# Patient Record
Sex: Female | Born: 1935 | Race: White | Hispanic: No | Smoking: Never smoker
Health system: Southern US, Community
[De-identification: ages and names within clinical notes are randomized; demographics above are authoritative.]

## PROBLEM LIST (undated history)

## (undated) DIAGNOSIS — C50919 Malignant neoplasm of unspecified site of unspecified female breast: Secondary | ICD-10-CM

## (undated) DIAGNOSIS — Z87898 Personal history of other specified conditions: Secondary | ICD-10-CM

## (undated) DIAGNOSIS — I5022 Chronic systolic (congestive) heart failure: Secondary | ICD-10-CM

## (undated) DIAGNOSIS — I350 Nonrheumatic aortic (valve) stenosis: Secondary | ICD-10-CM

## (undated) DIAGNOSIS — R252 Cramp and spasm: Secondary | ICD-10-CM

## (undated) DIAGNOSIS — R5383 Other fatigue: Secondary | ICD-10-CM

## (undated) DIAGNOSIS — N189 Chronic kidney disease, unspecified: Secondary | ICD-10-CM

## (undated) DIAGNOSIS — I679 Cerebrovascular disease, unspecified: Secondary | ICD-10-CM

## (undated) DIAGNOSIS — I050 Rheumatic mitral stenosis: Secondary | ICD-10-CM

## (undated) DIAGNOSIS — I340 Nonrheumatic mitral (valve) insufficiency: Secondary | ICD-10-CM

## (undated) DIAGNOSIS — I071 Rheumatic tricuspid insufficiency: Secondary | ICD-10-CM

## (undated) DIAGNOSIS — I517 Cardiomegaly: Secondary | ICD-10-CM

## (undated) DIAGNOSIS — I272 Pulmonary hypertension, unspecified: Secondary | ICD-10-CM

## (undated) HISTORY — PX: MASTECTOMY: SHX3

## (undated) HISTORY — DX: Rheumatic tricuspid insufficiency: I07.1

## (undated) HISTORY — DX: Malignant neoplasm of unspecified site of unspecified female breast: C50.919

## (undated) HISTORY — DX: Cramp and spasm: R25.2

## (undated) HISTORY — DX: Cardiomegaly: I51.7

## (undated) HISTORY — DX: Personal history of other specified conditions: Z87.898

## (undated) HISTORY — DX: Pulmonary hypertension, unspecified: I27.20

## (undated) HISTORY — PX: APPENDECTOMY: SHX54

## (undated) HISTORY — DX: Nonrheumatic mitral (valve) insufficiency: I34.0

## (undated) HISTORY — DX: Rheumatic mitral stenosis: I05.0

## (undated) HISTORY — DX: Other fatigue: R53.83

---

## 1998-05-17 ENCOUNTER — Other Ambulatory Visit: Admission: RE | Admit: 1998-05-17 | Discharge: 1998-05-17 | Payer: Self-pay | Admitting: Cardiology

## 1999-10-31 ENCOUNTER — Other Ambulatory Visit: Admission: RE | Admit: 1999-10-31 | Discharge: 1999-10-31 | Payer: Self-pay | Admitting: General Surgery

## 2000-01-29 ENCOUNTER — Encounter: Admission: RE | Admit: 2000-01-29 | Discharge: 2000-04-28 | Payer: Self-pay | Admitting: *Deleted

## 2000-06-04 ENCOUNTER — Other Ambulatory Visit: Admission: RE | Admit: 2000-06-04 | Discharge: 2000-06-04 | Payer: Self-pay | Admitting: *Deleted

## 2000-10-01 ENCOUNTER — Ambulatory Visit (HOSPITAL_COMMUNITY): Admission: RE | Admit: 2000-10-01 | Discharge: 2000-10-01 | Payer: Self-pay | Admitting: Specialist

## 2001-06-02 ENCOUNTER — Other Ambulatory Visit: Admission: RE | Admit: 2001-06-02 | Discharge: 2001-06-02 | Payer: Self-pay | Admitting: *Deleted

## 2002-10-11 ENCOUNTER — Other Ambulatory Visit: Admission: RE | Admit: 2002-10-11 | Discharge: 2002-10-11 | Payer: Self-pay | Admitting: *Deleted

## 2003-12-21 ENCOUNTER — Encounter: Admission: RE | Admit: 2003-12-21 | Discharge: 2004-01-10 | Payer: Self-pay | Admitting: *Deleted

## 2005-05-24 ENCOUNTER — Encounter: Admission: RE | Admit: 2005-05-24 | Discharge: 2005-05-24 | Payer: Self-pay | Admitting: Internal Medicine

## 2006-09-17 ENCOUNTER — Inpatient Hospital Stay (HOSPITAL_COMMUNITY): Admission: EM | Admit: 2006-09-17 | Discharge: 2006-10-03 | Payer: Self-pay | Admitting: Emergency Medicine

## 2006-09-17 ENCOUNTER — Encounter (INDEPENDENT_AMBULATORY_CARE_PROVIDER_SITE_OTHER): Payer: Self-pay | Admitting: *Deleted

## 2006-09-26 ENCOUNTER — Encounter: Payer: Self-pay | Admitting: Cardiology

## 2006-11-03 ENCOUNTER — Encounter: Admission: RE | Admit: 2006-11-03 | Discharge: 2006-11-03 | Payer: Self-pay | Admitting: General Surgery

## 2009-01-22 IMAGING — CT CT ABDOMEN W/ CM
2 of 6 series · 16 of 46 positions shown, 18 images · IV contrast (omnipaque)
Comparison: None

ABDOMEN CT WITH CONTRAST

CLINICAL DATA: Lower abdominal pain
TECHNIQUE: Multidetector CT imaging of the abdomen and pelvis was performed
following the standard protocol during bolus administration of intravenous
contrast.

Contrast:  125 cc Omnipaque 300

[Series 2: abd_pel 5.0 b40f st · axial · 0.65mm/px · z∈[-406,-30]mm · 13 of 87 slices shown, 15 images]
[im 6/87  soft-tissue]
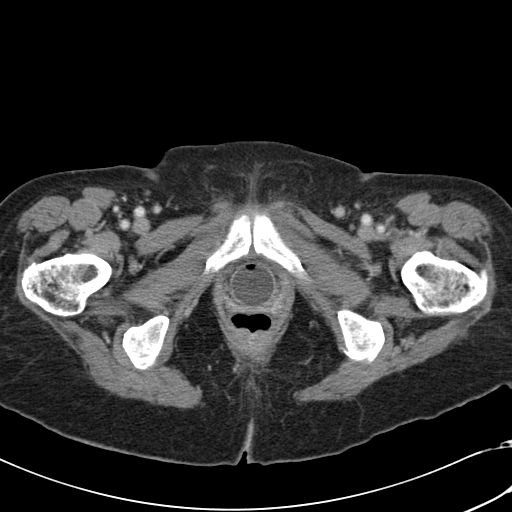
[im 6/87  bone]
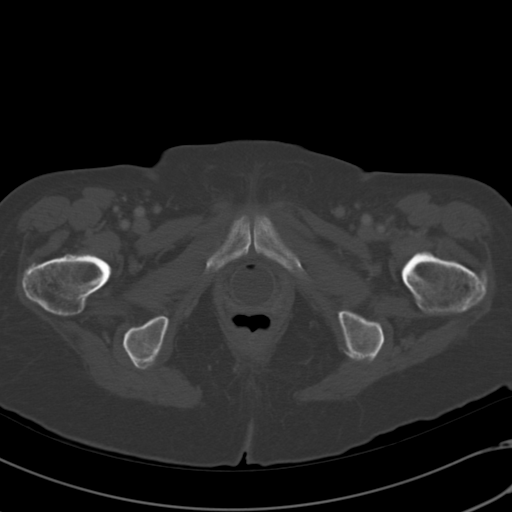
[im 11/87  soft-tissue]
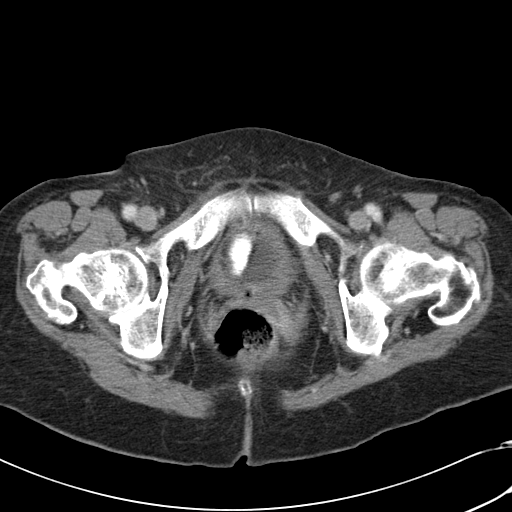
[im 21/87  soft-tissue]
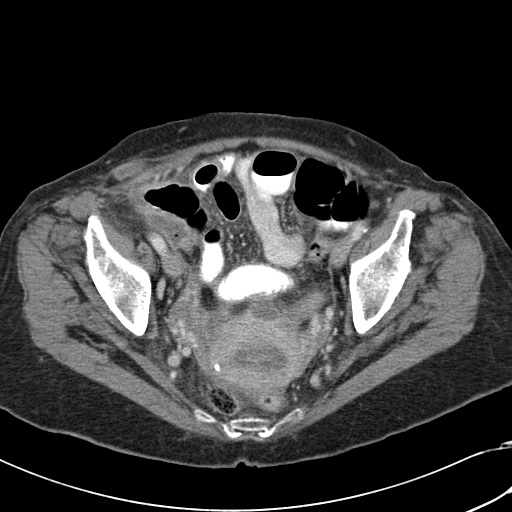
[im 26/87  soft-tissue]
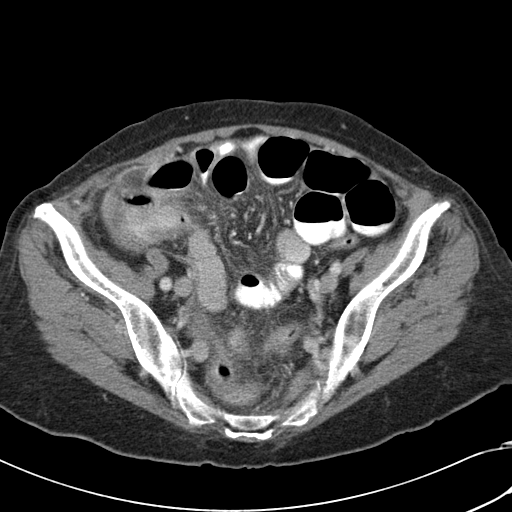
[im 31/87  soft-tissue]
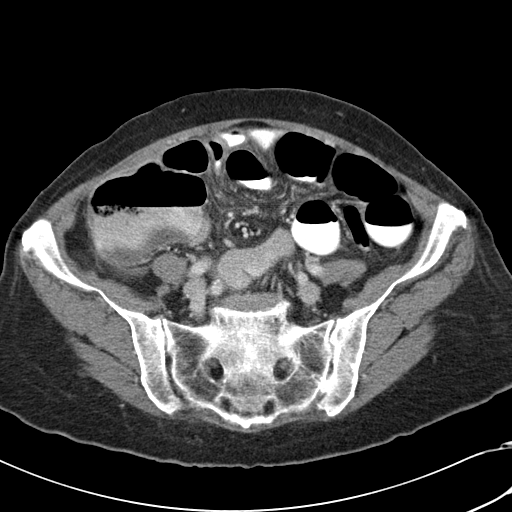
[im 36/87  soft-tissue]
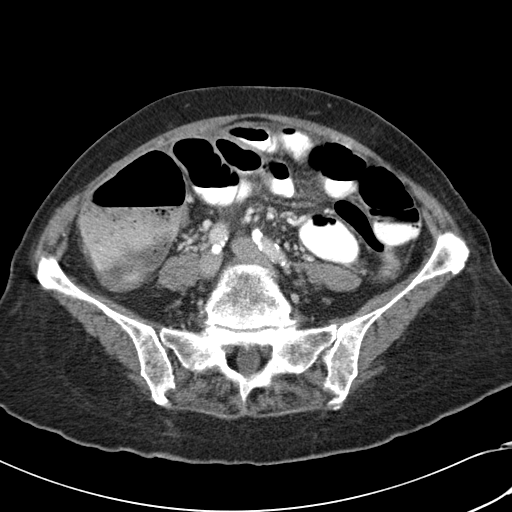
[im 46/87  soft-tissue]
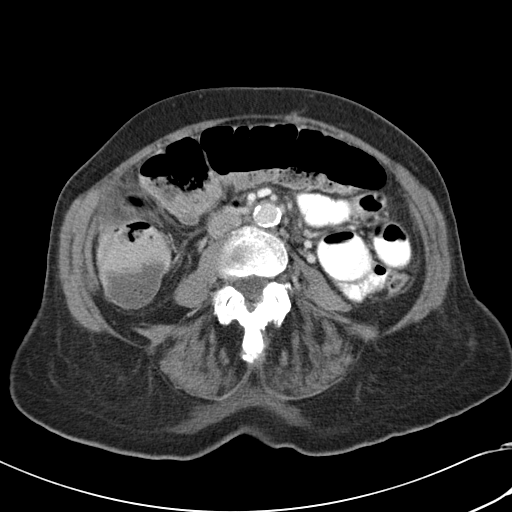
[im 51/87  soft-tissue]
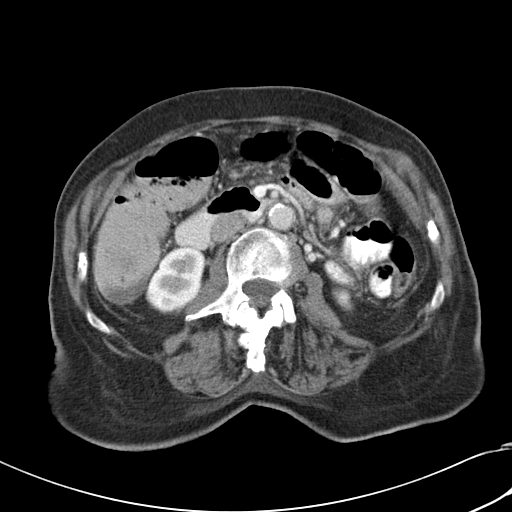
[im 56/87  soft-tissue]
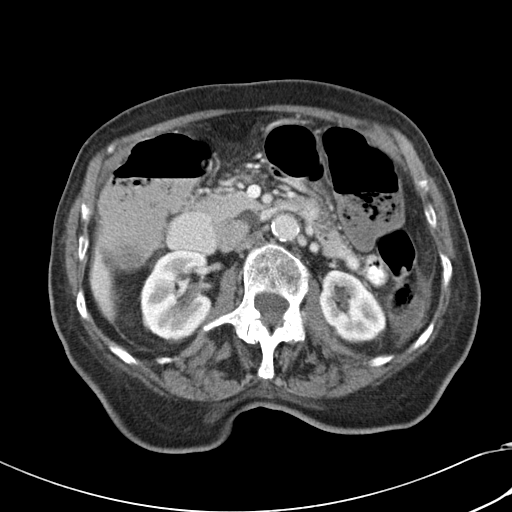
[im 56/87  bone]
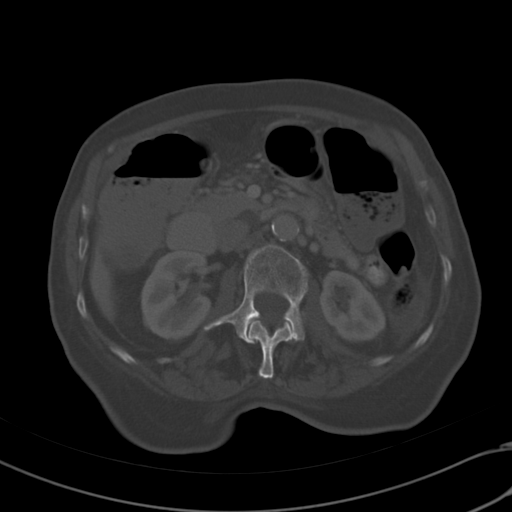
[im 61/87  soft-tissue]
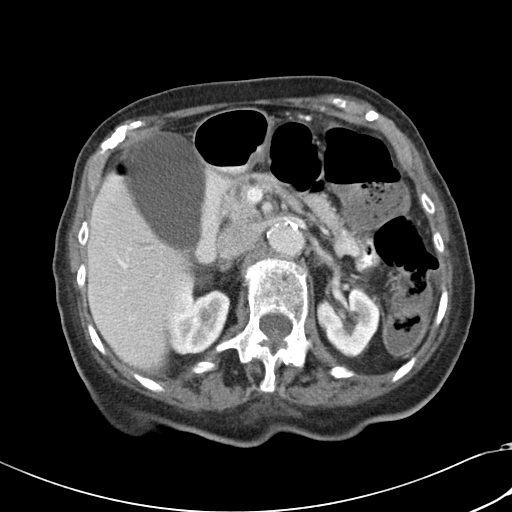
[im 66/87  soft-tissue]
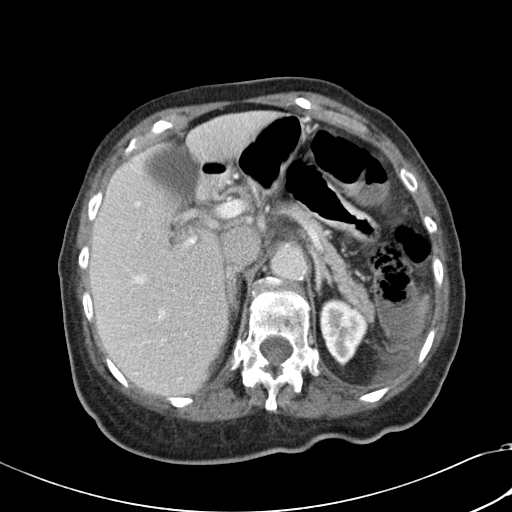
[im 76/87  soft-tissue]
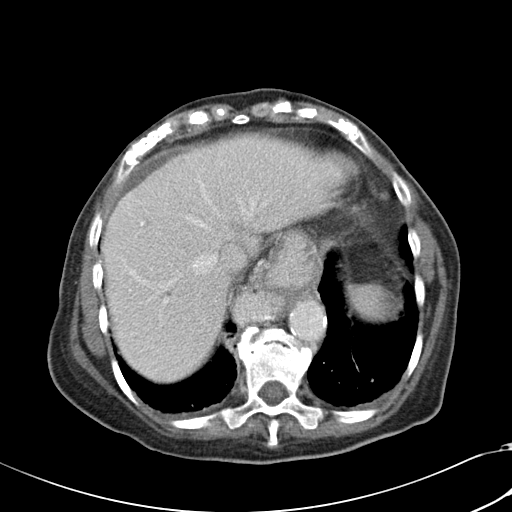
[im 81/87  soft-tissue]
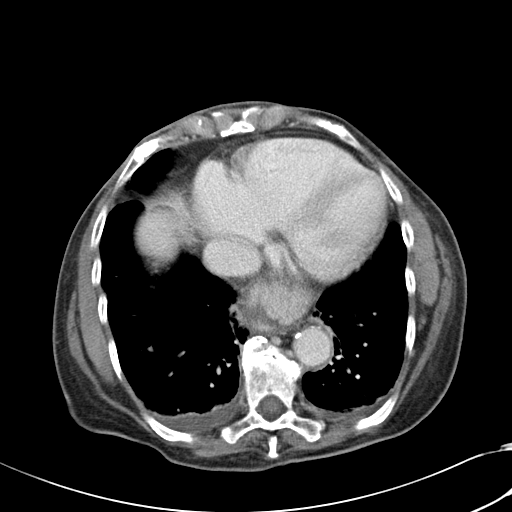

[Series 602: coronal images · coronal · 0.89mm/px · 3 of 74 slices shown]
[im 25/74  soft-tissue]
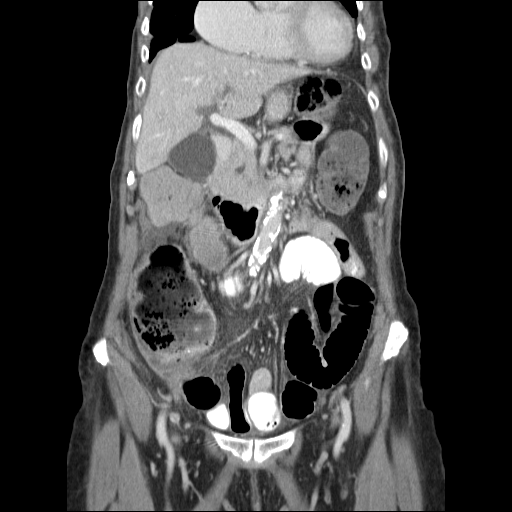
[im 33/74  soft-tissue]
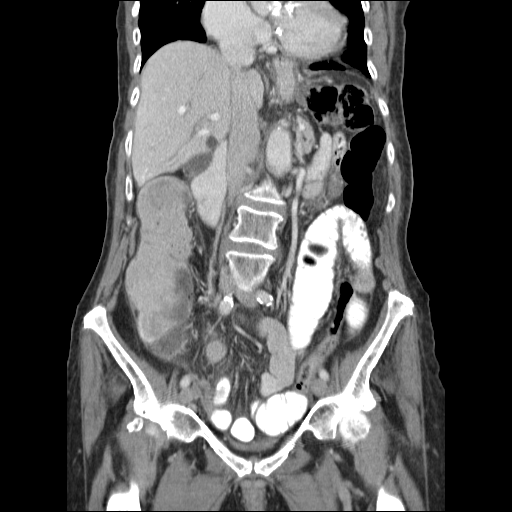
[im 41/74  soft-tissue]
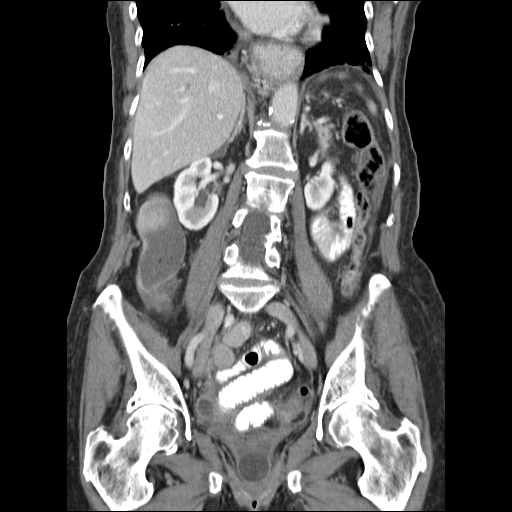

[16 of 46 positions shown; findings below may reference images not displayed]

FINDINGS: There is mild gaseous distention of the colon, with scattered
air-fluid levels. Mildly prominent small bowel loops noted as well. There is
small amount of edema adjacent to the right side of the colon. The colon
underlying this area is not thick walled. Small amount of fluid is also noted
adjacent to the proximal descending colon. This is seen on image 34-35.

There is mild gallbladder distention without definite wall thickening. Slight
intrahepatic biliary prominence noted. No focal lesion in the liver, spleen,
pancreas, adrenals, or kidneys. No free fluid, or free air.

There are small bilateral pleural effusions and minimal bibasilar atelectasis.
Mild cardiomegaly noted. Moderate sized hiatal hernia present.

IMPRESSION

Nonspecific bowel gas pattern with mild gaseous distention of colon and small
bowel. Question mild ileus. There are air-fluid levels seen within the colon
which can be seen with gastroenteritis. There are also vague areas of edema or
free fluid adjacent to both the ascending and descending colon without
underlying wall thickening.

Mild distention of the gallbladder without definite stones or wall thickening.

PELVIS CT WITH CONTRAST
FINDINGS: There is a small amount of fluid and/or stranding noted adjacent to
the cecum. A small amount of free fluid noted in the pelvis. Again, no areas of
wall thickening within the colon. Mildly distended small bowel loops and colon
again noted in the pelvis as described in the abdomen report. Foley catheter is
in a decompressed bladder. There is fluid within the endometrium. No adnexal
masses.

IMPRESSION

Again, somewhat nondescript and nonspecific bowel gas pattern with mild gaseous
distention of small bowel and colon. Air-fluid levels in the colon present.
There is a small amount of free fluid. Slight stranding adjacent to the cecum
without underlying wall thickening. Appendix not definitively seen. Findings are
nonspecific. Question gastroenteritis. 

Fluid within the endometrium which is an abnormal finding in this age group.
This can be followed up with elective pelvic ultrasound.

## 2010-05-21 ENCOUNTER — Emergency Department (HOSPITAL_COMMUNITY): Admission: EM | Admit: 2010-05-21 | Discharge: 2010-05-21 | Payer: Self-pay | Admitting: Emergency Medicine

## 2010-05-21 ENCOUNTER — Ambulatory Visit: Payer: Self-pay | Admitting: Oncology

## 2010-05-29 ENCOUNTER — Ambulatory Visit (HOSPITAL_COMMUNITY): Admission: RE | Admit: 2010-05-29 | Discharge: 2010-05-29 | Payer: Self-pay | Admitting: Oncology

## 2010-05-29 LAB — CBC WITH DIFFERENTIAL/PLATELET
BASO%: 0.2 % (ref 0.0–2.0)
Eosinophils Absolute: 0.1 10*3/uL (ref 0.0–0.5)
LYMPH%: 15.1 % (ref 14.0–49.7)
NEUT%: 74.1 % (ref 38.4–76.8)
nRBC: 0 % (ref 0–0)

## 2010-05-29 LAB — LACTATE DEHYDROGENASE: LDH: 240 U/L (ref 94–250)

## 2010-05-29 LAB — COMPREHENSIVE METABOLIC PANEL
Albumin: 4.2 g/dL (ref 3.5–5.2)
BUN: 23 mg/dL (ref 6–23)
Calcium: 9.9 mg/dL (ref 8.4–10.5)
Glucose, Bld: 122 mg/dL — ABNORMAL HIGH (ref 70–99)
Total Bilirubin: 0.6 mg/dL (ref 0.3–1.2)
Total Protein: 6.9 g/dL (ref 6.0–8.3)

## 2010-06-07 ENCOUNTER — Ambulatory Visit (HOSPITAL_COMMUNITY): Admission: RE | Admit: 2010-06-07 | Discharge: 2010-06-07 | Payer: Self-pay | Admitting: Oncology

## 2010-07-16 ENCOUNTER — Ambulatory Visit: Payer: Self-pay | Admitting: Cardiology

## 2010-08-18 ENCOUNTER — Encounter: Payer: Self-pay | Admitting: Orthopedic Surgery

## 2010-11-09 ENCOUNTER — Telehealth: Payer: Self-pay | Admitting: *Deleted

## 2010-11-09 ENCOUNTER — Other Ambulatory Visit: Payer: Self-pay | Admitting: Cardiology

## 2010-11-09 NOTE — Telephone Encounter (Signed)
Called Rx. For Ntg 0.4 numbers 25 x 1yr. Refill to [employer] [facility] [employer] [employer]

## 2010-11-09 NOTE — Telephone Encounter (Signed)
PATIENT NEED SCRIPT FOR NTG, SCRIPT SHE HAD WAS TOO OLD.  PLEASE CALL AND FILL.  PHARM [phone].

## 2010-11-09 NOTE — Telephone Encounter (Signed)
Patient called with chest pressure,going on for several days.She spoke with Dr. Patty Sermons and she will try NTG and if no better she will go to ER at [facility].

## 2010-11-13 ENCOUNTER — Other Ambulatory Visit: Payer: Self-pay | Admitting: *Deleted

## 2010-11-13 MED ORDER — NITROGLYCERIN 0.4 MG SL SUBL: 0.4000 mg | SUBLINGUAL_TABLET | SUBLINGUAL | Status: AC | PRN

## 2010-11-13 NOTE — Telephone Encounter (Signed)
Refilled meds per fax request.  

## 2010-12-14 NOTE — Discharge Summary (Signed)
[name]                    ACCOUNT NO.:  [id number]   MEDICAL RECORD NO.:  [id number]         PATIENT TYPE:  LINP   LOCATION:                               FACILITY:  [facility]   PHYSICIAN:  Anselm Pancoast. Weatherly, M.D.DATE OF BIRTH:  07-26-1916   DATE OF ADMISSION:  09/17/2006  DATE OF DISCHARGE:  10/02/2006                               DISCHARGE SUMMARY   DISCHARGE DIAGNOSES:  1. Ruptured appendicitis with pelvic abscess.  2. Acute cholecystitis postoperative.  3. Congestive heart failure.   HISTORY:  [name] is an 77 year old Caucasian female who resides at  [facility] and she was thought to be having the flu syndrome for  approximately five days with nausea, vomiting, feeling poorly.  She is  followed by Dr. Frederik Pear, her primary physician, and was having nausea,  vomiting and fevers, and with this not improving, she was brought to the  [facility] Emergency Room where laboratory studies and CT were  obtained.  The CT was nonocclusive, had some nonspecific findings and  she was admitted by the hospice.  Dr. Hettie Holstein service, and the  following day, I was asked to see her, in that she had an elevated white  count on admission, but the CT had not shown any findings consistent  with an appendicitis or acute surgical problems, but he was concerned  that her abdominal pain was worsening and I was asked to see her on mid-  morning on the 19th.  On the examination, she was definitely tender,  more in the lower abdomen than in the upper abdomen and I reviewed the  CT with another radiologist, and on review of vertical films, not the  chronological cuts, we thought we could see an appendix very low in the  pelvis with __________  appendiceal fluid and just a little speck of  air.  The opinion at first was a nonspecific __________  no acute  surgical problem was thought to be a ruptured appendicitis.  I also  repeated her white count and it had increased from 14,000 to  19,000.  Arrangements were made to put her on the OR schedule as soon as possible  and she was taken to the OR approximately 2:00 p.m. for a laparotomy.  Her past history is significant in that she had a double mastectomy  about 35 years ago and is on tamoxifen.  She also is followed by a  cardiovascular standpoint by Dr. Patty Sermons and was on Lasix 40 mg a day  for __________  mild congestive heart failure.  She also takes Vitamin D  and Centrum Silver and she takes Bufferin over-the-counter for general  aches and pains.  On physical exam on admission, she was not febrile.  Her blood pressure was 104/60 and her O2 sats were fine.  On her  admission physical exam, her abdomen was described with some peritoneal  signs, more localized in the right lower quadrant, and kind of a vague  general abdominal tenderness which would be more likely consistent with  a surgical evaluation.  She had already  been started on antibiotics when  I saw her and she was taken to surgery.  Dr. Michaell Cowing assisted.  There was  a small midline incision.  She was found to have a ruptured appendicitis  with a pelvic abscess and several areas of __________  loop abscesses  and this obviously had been brewing for several days.  Post, I believe  she did go the stepdown ICU, but she really was able to breath  spontaneously, etc.  Since we had placed a nasogastric tube and she had  basically bilateral edema in each arm from her previous mastectomies, we  asked for a PICC line to be placed so we could start her on  hyperalimentation and this was performed the following day.  Her  creatinine clearance is low and we started her on TPN, which she  tolerated without problems.  Her cultures from the OR were consistent  with a ruptured abdominal viscus.  The primary organism is streptococcal  group F and also some gram positive rods noted on the smear, and the  path report showed acute suppurative appendicitis with perforation and   marked serositis.  The patient appeared to be improving.  Her abdomen  was becoming less tender.  She was transferred to the floor.  Still had  a Foley catheter.  We had placed a Blake drain in the lower abdomen, and  about 5 days after her surgery, she started having more abdominal  tenderness, and this was in the upper abdomen.  She was also having a  little low grade fever and the concern for possible intra-abdominal  abscess was raised and we repeated the CT on 09/23/2006, and this shows  a markedly distended gallbladder and bilateral pleural effusions.  No  evidence of intra-abdominal abscess and she was distended enough that it  would have been impossible to do a laparoscopic cholecystectomy.  I  recommended we proceed with a percutaneous drain of the gallbladder with  fluoro and this was performed with her pain greatly decreasing and she  sort of turned around fairly quickly at this point.  It appears that the  gallbladder had no stones noted on the CT or the ultrasound when we  placed the drain, was a secondary thing, probably secondary to not  eating, and she was not certainly not having acute gallbladder which she  was initially admitted.  The fluid from the gallbladder was no growth.  She was being treated with Zosyn and Flagyl initially for the ruptured  appendicitis and with the percutaneous drainage her abdominal pain  promptly decreased.  The NG tube could be removed the following day, and  then we started her on a diet which has been advanced up to a cardiac  diet.  With the bilateral pleural effusions and we had been giving her  intermittent Lasix and her IV fluids had always been at about 100 mL an  hour, which is on the low side.  I asked Dr. Patty Sermons to see her since  he is her regular physician, and wondered if we could do something to  kind of digitalize her or improve her cardiac function, and he saw her approximately two days later and recommended that we add  lisinopril 5  b.i.d. and Toprol XL 12.5 daily and did a bunch of cardiac evaluations.  A 2D echo showed a left ventricular ejection fraction of about 45-50%,  and kind of a diastolic dysfunction.  She continues to improve.  Her  abdominal incisions have healed  nicely and have been stable to remove  the Steri-strips.  The drain that was in the pelvis never had a large  amount of fluid following the first 24 hours and the drain has been  removed.  Of course, she still has the drain in her gallbladder and we  are planning to let her return to [facility] and we will evaluate the  gallbladder on whether we can just remove the drain in approximately  three weeks or whether it is going to be necessary to actually do a  cholecystectomy if stones would be identified.  She has never had  symptoms consistent with a gallbladder attack prior to this time.   DISCHARGE MEDICATIONS:  1. K-Dur 10 mg 2 tablets twice a day.  2. Lasix 40 mg a day.  3. Lisinopril 10 mg b.i.d..  4. Toprol XL 25 mg 1 a day.  5. Ecotrin 325 mg daily.  6. Tylenol as needed for pain.  7. Tamoxifen - 20 mg a day.  8. Centrum Silver 1 tablet daily.   Dr. Patty Sermons has left prescriptions for the K-Dur, lisinopril and  Toprol, and she already has a prescription for the Lasix which has been  a chronic medication.  Her abdomen is soft.  She is eating a cardiac low  salt diet without problems, having good bowel function, and is no longer  tender in the lower abdomen.  I will see her in the office in  approximately two weeks.  Dr. Patty Sermons has arranged to see her in  approximately three weeks.  She will go to infirmary at [facility]  for a few days.  She should be able to get back to the independent  living status after a short time if she is ambulatory and her O2 sats  are fine on room air.           ______________________________  Anselm Pancoast. Zachery Dakins, M.D.     WJW/MEDQ  D:  10/02/2006  T:  10/02/2006  Job:   [id number]   cc:   Cassell Clement, M.D.  Fax: [phone]   Lenon Curt. Chilton Si, M.D.  Fax: [phone]

## 2010-12-14 NOTE — H&P (Signed)
NAMEDEMARA, LOVER                    ACCOUNT NO.:  [id number]   MEDICAL RECORD NO.:  [id number]          PATIENT TYPE:  EMS   LOCATION:  ED                           FACILITY:  [facility]   PHYSICIAN:  Hettie Holstein, D.O.    DATE OF BIRTH:  1915-09-01   DATE OF ADMISSION:  09/16/2006  DATE OF DISCHARGE:                              HISTORY & PHYSICAL   PRIMARY CARE PHYSICIAN:  Lenon Curt. Chilton Si, M.D.   CHIEF COMPLAINT:  Severe abdominal pain.   HISTORY OF PRESENT ILLNESS:  Ms. [name] is a pleasant 77 year old  Caucasian female who resides at [facility].  She had been in her  usual state of health up until about a week ago when she stated she  developed flu-like symptoms.  About 3 days ago she developed nausea,  vomiting, and fevers; and she was evaluated in the emergency department.  Her CT scan was inconclusive and had some nonspecific findings.  Her  clinical exam was consistent with acute abdomen and rebound as well as  guarding.  Unfortunately general surgery consultation has not been  entertained until 10:00 a.m. which I have contacted Dr. Zachery Dakins for  evaluation.  Her CT scan in the department is as noted above, poorly  specific.  In any event her white count is 14.6; and she is not  currently febrile though describing fevers at home.   PAST MEDICAL HISTORY:  This is significant for breast cancer.  She has  had a double mastectomy one in [place], about 35 years ago; and the  other at [facility] about 2 years ago.  She had radiation treatment.  She has  history of cataract extraction and implants as well.  She states that  she has an enlarged heart otherwise no other information in this regard  is available.   SURGICAL HISTORY:  She retains her gallbladders, uterus, and appendix.   MEDICATIONS AT THE FACILITY:  1. Tamoxifen daily.  2. Lasix 40 mg a day.  3. Vitamin D once daily.  4. Centrum Silver once daily.   ALLERGIES:  She is allergic SODIUM.   SOCIAL HISTORY:  Her  daughter, Melburn Popper, can be reached at [phone]  or [phone]; her son is reachable at [phone] or [phone].  She  does not smoke or drink.  She resides at [facility] and continues to  drive.   FAMILY HISTORY:  This is noncontributory.   REVIEW OF SYSTEMS:  Noted above.  She reports a flu-like illness about a  week ago; and other than that, she has been in her usual state.  No  blood in her stools or emesis. Further review is unremarkable.   PHYSICAL EXAMINATION:  GENERAL:  In the department, initially she was  tachycardiac.  She received some IV fluids.  VITAL SIGNS:  Blood pressure, now, 104/60, temperature 98.1, heart rate  97, respirations 20, O2 saturation 96%.  HEENT:  Head is normocephalic, atraumatic.  Extraocular muscles are  intact.  NECK:  Supple, nontender with no palpable thyromegaly or mass.  CARDIOVASCULAR EXAM:  Reveals normal S1-S2.  LUNGS:  Clear to auscultation bilaterally.  She exhibits normal effort,  and no dullness to percussion.  ABDOMEN:  She does exhibit some guarding, some peritoneal signs with  exquisite tenderness and palpation of her right lower and right upper  quadrants.  She does not appear to be in extremis at this time.  EXTREMITIES:  Reveal trace bipedal edema.   LABORATORY DATA:  Her CT scan, as noted above, revealed nonspecific  bowel gas pattern, questionable gastroenteritis, and some fluid in her  uterus with recommendations for followup ultrasound at some point.  Sodium 128, potassium 2.9, BUN 19, creatinine 1.0, glucose 119, chloride  91, CO2 26.  SG/ALT 54/35, albumin 2.5.  WBC 4.6, hemoglobin 13.8,  platelet count of 176, MCV of 90.   ASSESSMENT:  1. Right lower quadrant pain, will need to rule out appendicitis with      general surgical evaluation and consultation.  2. Fever and leukocytosis.  3. History of breast cancer.  4. Fluid noted in her uterus.  5. Hypoalbuminemia.  6. Hypokalemia.  7. No code [place] status.    PLANS:  At this time, we have asked Dr. Zachery Dakins of general surgery to  consult on Ms. Kevorkian to evaluate for appendicitis.  In the meantime we  will start her on antibiotics empirically.      Hettie Holstein, D.O.  Electronically Signed     ESS/MEDQ  D:  09/17/2006  T:  09/17/2006  Job:  [id number]   cc:   Lenon Curt. Chilton Si, M.D.  Fax: [phone]

## 2010-12-14 NOTE — Consult Note (Signed)
[name], [name]                    ACCOUNT NO.:  [id number]   MEDICAL RECORD NO.:  [id number]          PATIENT TYPE:  INP   LOCATION:  1603                         FACILITY:  [facility]   PHYSICIAN:  Cassell Clement, M.D. DATE OF BIRTH:  Jun 01, 1916   DATE OF CONSULTATION:  09/25/2006  DATE OF DISCHARGE:                                 CONSULTATION   This is an 77 year old Caucasian female known to me.  She was admitted  on September 16, 2006, with abdominal pain and was found to have an acute  appendicitis with rupture and multiple intra-abdominal abscess.  She had  been sick for the previous week with a flu syndrome out at [facility], suffering bloating and cramping and lack of appetite.  She had  been urged by her family to go see the doctor several days earlier but  did not and became worse on was a prior to surgery.  She presented with  elevated white count and she was begun empirically on Cipro and Flagyl  and on the morning after admission, surgical consultation was requested  and Dr. Zachery Dakins saw the patient and felt that she had the possibility  of acute appendicitis and recommended urgent surgery.   The patient has a past history of cardiomegaly.  She was last seen in  our office in December 2006.  On July 18, 2005, she underwent a two-  dimensional echocardiogram which showed an ejection fraction of 55-60%,  impaired diastolic relaxation, moderate aortic stenosis, moderate to  severe mitral regurgitation, severe tricuspid regurgitation, and mild  pulmonary hypertension with an estimated right ventricular systolic  pressure of 49.  On August 08, 2005, she underwent an adenosine  Cardiolite stress test in our office which showed an ejection fraction  of 58% and showed no evidence of ischemic heart disease.  She had not  been seen in our office since the adenosine Cardiolite of January 2007.  At the time we last saw her, her only chronic medications were Lasix 20  mg daily and tamoxifen as well as multivitamins and Os-Cal and Bufferin.   FAMILY HISTORY:  Her father died of cancer at 38.  Mother died of heart  disease and stroke at 68.  She had a sister who died of cancer and a  brother who died of heart disease.   She does not use tobacco.  She drinks occasional alcohol.  She is a  widow.  She has a son and daughter who live nearby.   Previous surgery includes bilateral mastectomy.   PHYSICAL EXAMINATION:  VITAL SIGNS:  On physical exam tonight, her  temperature is 98.9, pulse 100 with PVCs.  Blood pressure 139/72.  CHEST:  Few rales.  GENERAL:  The patient is alert and oriented.  HEART:  A grade 2/6 apical systolic murmur of mitral regurgitation.  EXTREMITIES:  No pedal edema.   Her postoperative chest x-ray on September 22, 2006, had shown evidence  of cardiomegaly, early CHF with bilateral pleural effusions, and since  then she has been restarted on Lasix and is due for a  repeat chest x-ray  tomorrow.  Her electrocardiogram shows sinus rhythm with PACs but no  acute changes.   Lab work of interest on September 25, 2006, shows a white count climbing  to 18,100, hemoglobin of 13.  Sodium 138, potassium 3.5, chloride 105,  BUN 27, creatinine 0.8.  Amylases are still high at 180 and 172 and  serum albumin is low at 1.7.  Cholesterol is very low at 57 with  triglycerides of 72.  The prealbumin is low at 9.1.   IMPRESSION:  Chronic cardiomegaly and chronic multivalvular heart  disease with exacerbation of congestive heart failure postoperatively.  Her low serum albumin is also contributing to the tendency toward  serositis with pleural effusions.   RECOMMENDATIONS:  Agree with use of Lasix.  We will hold off on Lanoxin  at this point.  We will check labs in the morning including a B-  natriuretic peptide and another chest x-ray and will also get a 2-D  echo.  Will follow with you.           ______________________________  Cassell Clement, M.D.     TB/MEDQ  D:  09/25/2006  T:  09/26/2006  Job:  [id number]   cc:   Anselm Pancoast. Zachery Dakins, M.D.  [address], [address]  [place]  [place] [address]

## 2010-12-14 NOTE — Op Note (Signed)
[name], Mooney                    ACCOUNT NO.:  [id number]   MEDICAL RECORD NO.:  [id number]          PATIENT TYPE:  INP   LOCATION:  0101                         FACILITY:  [facility]   PHYSICIAN:  Anselm Pancoast. Weatherly, M.D.DATE OF BIRTH:  25-Jun-1916   DATE OF PROCEDURE:  09/17/2006  DATE OF DISCHARGE:                               OPERATIVE REPORT   PREOPERATIVE DIAGNOSIS:  Probably ruptured appendicitis with multiple  intra-abdominal abscesses.   OPERATIONS:  Exploratory laparotomy and appendectomy and breaking up and  drainage of multiple intra-abdominal loop abscesses.   HISTORY:  [name] is an 77 year old female who resides at [facility].  There have been numerous incidents of flu syndrome out there and  the patient has been sick for four or five days, bloating, cramping lack  of appetite.  Her children who visit her had requested that she go to a  doctor a couple days ago and she did not.  She worse yesterday.  She has  had a history of breast cancer and is on Lasix but otherwise has not had  any type of chronic severe problem.  She was seen in the emergency room  approximately 8 o'clock yesterday afternoon with Dr. Lynelle Doctor.  X-rays and  lab studies were obtained.  Her white count was elevated when she first  came in but she was not febrile and the CT was performed which showed a  very gaseous both large and small bowel and I could see possibly a  little fluid in the pelvis.  White count was 13,000, urinalysis  unremarkable and she was admitted by the hospitalist.  There were no  beds available and she spent the night in the emergency room.  They had  started her on Cipro and Flagyl and then this morning the [facility]  physicians on reexamining her found that she was certainly more tender  and they requested a surgical consultation and I saw the patient  probably about 11:00 a.m..  On examination she was definitely tender.  She had been receiving morphine and Dilaudid and was  talkative and as  far as on her symptoms she said she has had progressive lower abdominal  pain of about a week's duration, felt like she needed to have a bowel  movement but could not and on review of the CT we really could not see  the appendix but you could see a little fluid up around the ascending  colon.  I reviewed the films with her children and requested that we get  a repeat white count and went over and reviewed them with one of the  younger radiologists and he reconfigured it so we could see the  longitudinal cuts as well as the coronal cuts and you could see what  appears to be appendix with a little air around it real low, basically  in the pelvis right basically up beside the rectum and felt that this  was a ruptured appendix with multiple small interloop abscesses.  The  repeat white count that I had ordered was now 19,300 and I discussed  this with the patient's children and recommended we proceed on with a  laparotomy.  The patient has been on Coumadin but was not on Coumadin  during the last week or two and her coag studies are unremarkable.  I am  not sure why she was on Coumadin since I do not think that she has  definitely an atrial fibrillation.   The patient was taken to the operative suite.  Induction of general  anesthesia, endotracheal tube.  Later put an NG tube which I confirmed  was definitely in her stomach.  She has a pretty significant hiatus  hernia and the patient already has a Foley catheter.  She has made about  600 mL of urine over the last 8 hours and her potassium was a little  low, but she has received potassium throughout the day and we think that  will be okay.  A small incision midline was made.  Sharp dissection  through the skin and subcutaneous tissue and then very carefully entered  into the peritoneal cavity.  The small bowel loops and cecum were kind  of adherent to the undersurface of the midline incision and kind of  finger broken up.   There was a little thin turbid fluid in the pelvis  and then we made the incision little larger and could kind of mobilize  the cecum and found that she definitely does have a retrocecal appendix  that is very low in the pelvis and then there is a fluid collection  abscess in the pelvis also.  I divided the appendiceal mesentery between  [facility] and ligated the pedicle with 2-0 silks and then the appendix  itself was just basically gangrenous and looks like she is ruptured  really right at the junction with the cecum.  I took a Babcock right on  the base of the appendix, held it up and took a TA-30 and went across  the base and fired it and then invaginated this little staple line with  interrupted 3-0 silk sutures.  We then ran the small bowel with three  interloop abscesses where the bowel was very adherent with acute  inflammatory response and we broke these up, kind of peeled exudate off  and then thoroughly irrigated and aspirated.  I put a 19 Blake drain  into the pelvis and it runs right by where the appendix used to be and  then put the small bowel back kind of anatomical position after breaking  up all the inflammatory adhesions, confirmed the NG in the stomach,  positioned that then brought the omentum down and closed the midline  with about eight sutures of 0-0 Prolene, kind of figure-of-eight  sutures.  I put three staples to approximate the skin and then packed  the wound with Betadine soaked gauze 4x4s and then a sterile occlusive  dressing.  We will keep her on broad antibiotics.  I am not sure whether  she is allergic to penicillin or not.  If she is not, I think Zosyn and  Flagyl would be a better choice than the Cipro that she is presently and  we will make that change.  She has a history of breast cancer but there  was no evidence of any metastatic cancer or other problems.  She had a lot of aortic calcifications and all but not a significant aneurysm and  the bowel  itself certainly looked viable.  I had feared when I first saw  her with the findings that possibly  some intestine ischemia might be  occurring but she has had a good urine output and the blood pressures  has been adequate during this about 8-hour period that I have been  observing her.           ______________________________  Anselm Pancoast. Zachery Dakins, M.D.     WJW/MEDQ  D:  09/17/2006  T:  09/18/2006  Job:  [id number]

## 2011-01-03 ENCOUNTER — Telehealth: Payer: Self-pay | Admitting: Cardiology

## 2011-01-03 ENCOUNTER — Encounter: Payer: Self-pay | Admitting: Cardiology

## 2011-01-03 NOTE — Telephone Encounter (Signed)
PT SAID SHE WANTS TO SEE DR BB. SHE HAS BEEN SOB LATELY AND FEELING TIRED WHICH SHE SAID IS VERY ABNORMAL FOR HER BECAUSE SHE IS USUALLY VERY ACTIVE. ADVISED HER MELINDA AT [facility]UNCH AND SHE WILL WAIT FOR A CALL BACK THIS AFTERNOON.

## 2011-01-03 NOTE — Telephone Encounter (Signed)
Called stating she was "up all night last pm w/nose bleeds". Wants to be seen; also c/o of being SOB recently; and tired. No CP. Will work her in tomorrow to see Dr. Patty Sermons.

## 2011-01-04 ENCOUNTER — Ambulatory Visit (INDEPENDENT_AMBULATORY_CARE_PROVIDER_SITE_OTHER): Payer: Medicare HMO | Admitting: Cardiology

## 2011-01-04 ENCOUNTER — Encounter: Payer: Self-pay | Admitting: Cardiology

## 2011-01-04 VITALS — BP 130/80 | HR 107 | Ht 62.0 in | Wt 120.8 lb

## 2011-01-04 DIAGNOSIS — Z853 Personal history of malignant neoplasm of breast: Secondary | ICD-10-CM

## 2011-01-04 DIAGNOSIS — R0609 Other forms of dyspnea: Secondary | ICD-10-CM

## 2011-01-04 DIAGNOSIS — R0600 Dyspnea, unspecified: Secondary | ICD-10-CM | POA: Insufficient documentation

## 2011-01-04 DIAGNOSIS — I080 Rheumatic disorders of both mitral and aortic valves: Secondary | ICD-10-CM

## 2011-01-04 NOTE — Assessment & Plan Note (Signed)
This patient has a history of multi-valvular heart disease.  Her last echocardiogram on 01/12/10 showed severe aortic stenosis with mild to moderate aortic insufficiency, mild mitral stenosis and mild mitral regurgitation and severe tricuspid regurgitation with mild pulmonary hypertension.  She had normal left ventricular systolic function and impaired relaxation.  She is on no cardiac medications.  She sleeps flat.  She does not have orthopnea or paroxysmal nocturnal dyspnea.  She does have exertional dyspnea

## 2011-01-04 NOTE — Progress Notes (Signed)
[name] Date of Birth:  08/04/1915 [facility] [place] [address]   [address] [place], [place]  [address] [phone]           Fax   [phone]  History of Present Illness: This very pleasant 77 year old woman is seen for a six-month followup office visit.  She has a history of multi-valvular heart disease.  Her last echocardiogram on 01/12/10 showed severe aortic stenosis with mild to moderate aortic insufficiency, mild mitral stenosis and mild mitral regurgitation, severe tricuspid regurgitation with mild pulmonary hypertension, and mild LVH with normal systolic function and with impaired relaxation.  The patient has not been having any problem with fluid retention.  In the past she has had elevated B. Natruretic peptides and Lasix had been prescribed but she states that she never took it.  At the present time she has no evidence of volume overload.  She does not have any history of coronary disease.  She had a normal adenosine Cardiolite stress test on 08/08/05 her ejection fraction was 58%.  The patient has had significant exertional dyspnea but not dyspnea at rest.  She has had some recent severe nosebleeds.  She did not seek medical attention.  She had been taking Bufferin which contains aspirin and she was advised to switch to acetaminophen she sleeps flat without pillows at night and does not have orthopnea or paroxysmal nocturnal dyspnea.  Current Outpatient Prescriptions  Medication Sig Dispense Refill  . calcium carbonate (OS-CAL - DOSED IN MG OF ELEMENTAL CALCIUM) 1250 MG tablet Take 1 tablet by mouth daily.        . Cholecalciferol (VITAMIN D) 1000 UNITS capsule Take 1,000 Units by mouth daily.        . multivitamin-iron-minerals-folic acid (CENTRUM) chewable tablet Chew 1 tablet by mouth daily.        . nitroGLYCERIN (NITROQUICK) 0.4 MG SL tablet Place 1 tablet (0.4 mg total) under the tongue every 5 (five) minutes as needed for chest pain.  100 tablet  3  .  DISCONTD: furosemide (LASIX) 20 MG tablet Take 20 mg by mouth as needed.          Allergies  Allergen Reactions  . Darvocet (Propoxyphene N-Acetaminophen)   . Levaquin   . Lisinopril   . Tequin     Patient Active Problem List  Diagnoses  . Dyspnea  . Aortic stenosis with mitral and aortic insufficiency  . History of breast cancer    History  Smoking status  . Never Smoker   Smokeless tobacco  . Not on file    History  Alcohol Use No    Family History  Problem Relation Age of Onset  . Stroke Mother   . Cancer Father   . Cancer Sister   . Heart disease Brother     Review of Systems: Constitutional: no fever chills diaphoresis or fatigue or change in weight.  Head and neck: no hearing loss, no epistaxis, no photophobia or visual disturbance. Respiratory: No cough, shortness of breath or wheezing. Cardiovascular: No chest pain peripheral edema, palpitations. Gastrointestinal: No abdominal distention, no abdominal pain, no change in bowel habits hematochezia or melena. Genitourinary: No dysuria, no frequency, no urgency, no nocturia. Musculoskeletal:No arthralgias, no back pain, no gait disturbance or myalgias. Neurological: No dizziness, no headaches, no numbness, no seizures, no syncope, no weakness, no tremors. Hematologic: No lymphadenopathy, no easy bruising. Psychiatric: No confusion, no hallucinations, no sleep disturbance.    Physical Exam: Filed Vitals:  01/04/11 1551  BP: 130/80  Pulse: 107  The general appearance reveals a well-developed well-nourished elderly woman who appears younger than her stated age.The head and neck exam reveals pupils equal and reactive.  Extraocular movements are full.  There is no scleral icterus.  The mouth and pharynx are normal.  The neck is supple.  The carotids reveal no bruits.  The jugular venous pressure is normal.  The  thyroid is not enlarged.  There is no lymphadenopathy.  The chest is clear to percussion and  auscultation.  There are no rales or rhonchi.  Expansion of the chest is symmetrical.  The precordium is quiet.  The first heart sound is normal.  The second heart sound is physiologically split. There is a grade 2/6 systolic ejection murmur at the base consistent with aortic stenosis.  There is no abnormal lift or heave.  The abdomen is soft and nontender.  The bowel sounds are normal.  The liver and spleen are not enlarged.  There are no abdominal masses.  There are no abdominal bruits.  Extremities reveal good pedal pulses.  There is no phlebitis or edema.  There is no cyanosis or clubbing.  Strength is normal and symmetrical in all extremities.  There is no lateralizing weakness.  There are no sensory deficits.  The skin is warm and dry.  There is no rash.  EKG today shows sinus tachycardia and incomplete right bundle-branch block and no significant change since 01/12/10  Assessment / Plan: Rechecked her oxygen saturations today and it is normal at 94%.  Her exertional dyspnea is not on a pulmonary basis.  It is most likely secondary to her multi-valvular heart disease.  She is having difficulty walking from one end of her retirement home to other areas and we are going to give her a prescription for a motorized scooter at her request.  Recheck in 4 months for followup office visit.

## 2011-01-04 NOTE — Assessment & Plan Note (Signed)
Patient has had previous bilateral mastectomies.  There has been no clinical evidence of recurrent breast cancer.  Her last chest x-ray here was 07/27/07 and showed cardiomegaly but no heart failure And no evidence of a recurrent breast cancer

## 2011-01-09 ENCOUNTER — Encounter: Payer: Self-pay | Admitting: Cardiology

## 2011-02-09 ENCOUNTER — Emergency Department (HOSPITAL_COMMUNITY)
Admission: EM | Admit: 2011-02-09 | Discharge: 2011-02-09 | Disposition: A | Discharge status: Home / Self Care | Payer: Medicare HMO | Attending: Emergency Medicine | Admitting: Emergency Medicine

## 2011-02-09 DIAGNOSIS — Z79810 Long term (current) use of selective estrogen receptor modulators (SERMs): Secondary | ICD-10-CM | POA: Insufficient documentation

## 2011-02-09 DIAGNOSIS — R58 Hemorrhage, not elsewhere classified: Secondary | ICD-10-CM | POA: Insufficient documentation

## 2011-02-09 DIAGNOSIS — I83893 Varicose veins of bilateral lower extremities with other complications: Secondary | ICD-10-CM | POA: Insufficient documentation

## 2011-02-09 DIAGNOSIS — Z853 Personal history of malignant neoplasm of breast: Secondary | ICD-10-CM | POA: Insufficient documentation

## 2011-02-09 LAB — POCT I-STAT, CHEM 8
BUN: 37 mg/dL — ABNORMAL HIGH (ref 6–23)
Calcium, Ion: 1.12 mmol/L (ref 1.12–1.32)
Creatinine, Ser: 1.2 mg/dL — ABNORMAL HIGH (ref 0.50–1.10)
HCT: 38 % (ref 36.0–46.0)
Potassium: 4.4 mEq/L (ref 3.5–5.1)
Sodium: 139 mEq/L (ref 135–145)
TCO2: 21 mmol/L (ref 0–100)

## 2011-04-18 ENCOUNTER — Telehealth: Payer: Self-pay | Admitting: Cardiology

## 2011-04-18 NOTE — Telephone Encounter (Signed)
Pt is calling about rx for motorized wheelchair. She needs another one. Please leave at front so she can pick up

## 2011-04-18 NOTE — Telephone Encounter (Signed)
Advised ready to pick up

## 2011-05-01 ENCOUNTER — Other Ambulatory Visit: Payer: Self-pay | Admitting: Dermatology

## 2011-06-11 ENCOUNTER — Ambulatory Visit: Payer: Medicare HMO | Admitting: Internal Medicine

## 2011-06-13 ENCOUNTER — Other Ambulatory Visit: Payer: Self-pay | Admitting: Dermatology

## 2011-07-01 ENCOUNTER — Ambulatory Visit (INDEPENDENT_AMBULATORY_CARE_PROVIDER_SITE_OTHER)
Admission: RE | Admit: 2011-07-01 | Discharge: 2011-07-01 | Disposition: A | Discharge status: Home / Self Care | Payer: Medicare HMO | Source: Ambulatory Visit | Attending: Cardiology | Admitting: Cardiology

## 2011-07-01 ENCOUNTER — Encounter: Payer: Self-pay | Admitting: Cardiology

## 2011-07-01 ENCOUNTER — Ambulatory Visit (INDEPENDENT_AMBULATORY_CARE_PROVIDER_SITE_OTHER): Payer: Medicare HMO | Admitting: Cardiology

## 2011-07-01 VITALS — BP 128/78 | HR 96 | Ht 62.0 in | Wt 123.0 lb

## 2011-07-01 DIAGNOSIS — R040 Epistaxis: Secondary | ICD-10-CM

## 2011-07-01 DIAGNOSIS — I350 Nonrheumatic aortic (valve) stenosis: Secondary | ICD-10-CM

## 2011-07-01 DIAGNOSIS — R05 Cough: Secondary | ICD-10-CM

## 2011-07-01 DIAGNOSIS — I359 Nonrheumatic aortic valve disorder, unspecified: Secondary | ICD-10-CM

## 2011-07-01 DIAGNOSIS — R059 Cough, unspecified: Secondary | ICD-10-CM

## 2011-07-01 DIAGNOSIS — R0600 Dyspnea, unspecified: Secondary | ICD-10-CM

## 2011-07-01 DIAGNOSIS — I080 Rheumatic disorders of both mitral and aortic valves: Secondary | ICD-10-CM

## 2011-07-01 DIAGNOSIS — Z853 Personal history of malignant neoplasm of breast: Secondary | ICD-10-CM

## 2011-07-01 DIAGNOSIS — R0609 Other forms of dyspnea: Secondary | ICD-10-CM

## 2011-07-01 NOTE — Patient Instructions (Signed)
Will get chest xray and call you with the results Your physician recommends that you continue on your current medications as directed. Please refer to the Current Medication list given to you today. Your physician wants you to follow-up in: 6 months You will receive a reminder letter in the mail two months in advance. If you don't receive a letter, please call our office to schedule the follow-up appointment.

## 2011-07-01 NOTE — Assessment & Plan Note (Signed)
Patient has had problems with epistaxis.  She also had a varicose vein on her foot recently which led profusely according to the patient.  The patient has been taking occasional aspirin 325 mg daily for arthritis.  Because of the epistaxis we will have her stop her aspirin now and just use Tylenol.

## 2011-07-01 NOTE — Assessment & Plan Note (Signed)
The patient has exertional dyspnea.  He is concerned about her lungs because she has also had a dry cough and we are going to send her for a chest x-ray soon.

## 2011-07-01 NOTE — Progress Notes (Signed)
[name] Date of Birth:  Feb 17, 1916 [facility] / [facility] [address]   [address] [place], [place]  [address] [phone]           Fax   [phone]  History of Present Illness: This pleasant 77 year old woman is seen for a scheduled 6 month followup office visit.  His history of multi-valvular heart disease.  Her last echocardiogram on 01/12/10 showed severe aortic stenosis with mild to moderate aortic insufficiency, mild mitral stenosis and mild mitral regurgitation, severe tricuspid regurgitation with mild pulmonary hypertension, and mild LVH with normal systolic function and with impaired relaxation.  She has had problems in the past with dyspnea and has an elevated B. natruretic peptide levels in the past.  We have prescribed Lasix for her but she has not taken it.  Current Outpatient Prescriptions  Medication Sig Dispense Refill  . calcium carbonate (OS-CAL - DOSED IN MG OF ELEMENTAL CALCIUM) 1250 MG tablet Take 1 tablet by mouth daily.        . multivitamin-iron-minerals-folic acid (CENTRUM) chewable tablet Chew 1 tablet by mouth daily.        . nitroGLYCERIN (NITROQUICK) 0.4 MG SL tablet Place 1 tablet (0.4 mg total) under the tongue every 5 (five) minutes as needed for chest pain.  100 tablet  3    Allergies  Allergen Reactions  . Darvocet (Propoxyphene N-Acetaminophen)   . Levaquin   . Lisinopril   . Tequin     Patient Active Problem List  Diagnoses  . Dyspnea  . Aortic stenosis with mitral and aortic insufficiency  . History of breast cancer    History  Smoking status  . Never Smoker   Smokeless tobacco  . Not on file    History  Alcohol Use No    Family History  Problem Relation Age of Onset  . Stroke Mother   . Cancer Father   . Cancer Sister   . Heart disease Brother     Review of Systems: Constitutional: no fever chills diaphoresis or fatigue or change in weight.  Head and neck: no hearing loss, no epistaxis, no photophobia or  visual disturbance. Respiratory: No cough, shortness of breath or wheezing. Cardiovascular: No chest pain peripheral edema, palpitations. Gastrointestinal: No abdominal distention, no abdominal pain, no change in bowel habits hematochezia or melena. Genitourinary: No dysuria, no frequency, no urgency, no nocturia. Musculoskeletal:No arthralgias, no back pain, no gait disturbance or myalgias. Neurological: No dizziness, no headaches, no numbness, no seizures, no syncope, no weakness, no tremors. Hematologic: No lymphadenopathy, no easy bruising. Psychiatric: No confusion, no hallucinations, no sleep disturbance.    Physical Exam: Filed Vitals:   07/01/11 1445  BP: 128/78  Pulse: 96   the general appearance reveals an elderly woman who appears younger than her stated age.Pupils equal and reactive.   Extraocular Movements are full.  There is no scleral icterus.  The mouth and pharynx are normal.  The neck is supple.  The carotids reveal no bruits.  The jugular venous pressure is normal.  The thyroid is not enlarged.  There is no lymphadenopathy.  The chest is clear to percussion and auscultation. There are no rales or rhonchi. Expansion of the chest is symmetrical.  Heart reveals a grade 2/6 systolic ejection murmur of aortic stenosis.  Her carotid artery pulse is still brisk.The abdomen is soft and nontender. Bowel sounds are normal. The liver and spleen are not enlarged. There Are no abdominal masses. There are no bruits.  Extremities  no phlebitis or edema theStrength is normal and symmetrical in all extremities.  There is no lateralizing weakness.  There are no sensory deficits.  The skin is warm and dry.  There is no rash.    Assessment / Plan: The patient is to continue same medication except she should avoid aspirin because of the bouts of epistaxis.  Recheck in 6 months for followup office visit and EKG.

## 2011-07-01 NOTE — Assessment & Plan Note (Signed)
Despite her multi valvular heart disease the patient is not experiencing any overt symptoms of congestive heart failure.  She denies any angina pectoris.  She states that she still walks fast.

## 2011-07-02 ENCOUNTER — Telehealth: Payer: Self-pay | Admitting: *Deleted

## 2011-07-02 NOTE — Telephone Encounter (Signed)
Message copied by Burnell Blanks on Tue Jul 02, 2011  9:03 AM ------      Message from: Cassell Clement      Created: Mon Jul 01, 2011  7:25 PM       Please report.  The chest x-ray shows an enlarged heart as before.  There is no pulmonary edema.  There are some mild central changes of bronchitis and or atelectasis.  For now she should take Mucinex 600 mg twice a day and if she begins to run any fever or have purulent sputum she should call us for an antibiotic.

## 2011-07-02 NOTE — Telephone Encounter (Signed)
Advised of chest xray results 

## 2011-07-18 ENCOUNTER — Emergency Department (HOSPITAL_COMMUNITY)
Admission: EM | Admit: 2011-07-18 | Discharge: 2011-07-18 | Disposition: A | Discharge status: Skilled Nursing Facility | Payer: Medicare HMO | Attending: Emergency Medicine | Admitting: Emergency Medicine

## 2011-07-18 ENCOUNTER — Other Ambulatory Visit: Payer: Self-pay

## 2011-07-18 ENCOUNTER — Encounter (HOSPITAL_COMMUNITY): Payer: Self-pay | Admitting: Emergency Medicine

## 2011-07-18 DIAGNOSIS — R011 Cardiac murmur, unspecified: Secondary | ICD-10-CM | POA: Insufficient documentation

## 2011-07-18 DIAGNOSIS — I2789 Other specified pulmonary heart diseases: Secondary | ICD-10-CM | POA: Insufficient documentation

## 2011-07-18 DIAGNOSIS — R42 Dizziness and giddiness: Secondary | ICD-10-CM | POA: Insufficient documentation

## 2011-07-18 DIAGNOSIS — Z853 Personal history of malignant neoplasm of breast: Secondary | ICD-10-CM | POA: Insufficient documentation

## 2011-07-18 LAB — URINALYSIS, ROUTINE W REFLEX MICROSCOPIC
Bilirubin Urine: NEGATIVE
Glucose, UA: NEGATIVE mg/dL
Hgb urine dipstick: NEGATIVE
Ketones, ur: NEGATIVE mg/dL
Leukocytes, UA: NEGATIVE
Nitrite: NEGATIVE
Protein, ur: NEGATIVE mg/dL
Specific Gravity, Urine: 1.02 (ref 1.005–1.030)
Urobilinogen, UA: 0.2 mg/dL (ref 0.0–1.0)
pH: 6 (ref 5.0–8.0)

## 2011-07-18 LAB — POCT I-STAT, CHEM 8
BUN: 27 mg/dL — ABNORMAL HIGH (ref 6–23)
Calcium, Ion: 1.09 mmol/L — ABNORMAL LOW (ref 1.12–1.32)
Calcium, Ion: 1.07 mmol/L — ABNORMAL LOW (ref 1.12–1.32)
Chloride: 109 mEq/L (ref 96–112)
Creatinine, Ser: 0.9 mg/dL (ref 0.50–1.10)
Glucose, Bld: 125 mg/dL — ABNORMAL HIGH (ref 70–99)
HCT: 42 % (ref 36.0–46.0)
Hemoglobin: 15 g/dL (ref 12.0–15.0)
Hemoglobin: 14.3 g/dL (ref 12.0–15.0)
Potassium: 4.1 mEq/L (ref 3.5–5.1)
Sodium: 137 mEq/L (ref 135–145)
Sodium: 141 mEq/L (ref 135–145)
TCO2: 24 mmol/L (ref 0–100)
TCO2: 23 mmol/L (ref 0–100)

## 2011-07-18 MED ORDER — MECLIZINE HCL 25 MG PO TABS
25.0000 mg | ORAL_TABLET | Freq: Once | ORAL | Status: AC
  Administered 2011-07-18: 25 mg via ORAL
  Filled 2011-07-18: qty 1

## 2011-07-18 MED ORDER — ONDANSETRON HCL 4 MG PO TABS: 4.0000 mg | ORAL_TABLET | Freq: Four times a day (QID) | ORAL | Status: AC

## 2011-07-18 MED ORDER — ONDANSETRON 4 MG PO TBDP
4.0000 mg | ORAL_TABLET | Freq: Once | ORAL | Status: AC
  Administered 2011-07-18: 4 mg via ORAL
  Filled 2011-07-18: qty 1

## 2011-07-18 MED ORDER — SODIUM POLYSTYRENE SULFONATE 15 GM/60ML PO SUSP
15.0000 g | Freq: Once | ORAL | Status: AC
  Administered 2011-07-18: 15 g via ORAL
  Filled 2011-07-18: qty 60

## 2011-07-18 MED ORDER — SODIUM CHLORIDE 0.9 % IV BOLUS (SEPSIS)
500.0000 mL | Freq: Once | INTRAVENOUS | Status: AC
  Administered 2011-07-18: 500 mL via INTRAVENOUS

## 2011-07-18 MED ORDER — MECLIZINE HCL 50 MG PO TABS: 50.0000 mg | ORAL_TABLET | Freq: Three times a day (TID) | ORAL | Status: AC | PRN

## 2011-07-18 MED ORDER — ONDANSETRON HCL 4 MG/2ML IJ SOLN
INTRAMUSCULAR | Status: AC
  Administered 2011-07-18: 04:00:00
  Filled 2011-07-18: qty 2

## 2011-07-18 NOTE — ED Notes (Signed)
Per [facility], pt from [facility] facility with c/o nausea and dizziness about 1 hour prior to ED arrival. No vomiting but pt dry heaving. No c/o pain

## 2011-07-18 NOTE — ED Provider Notes (Signed)
History     CSN: [id number] Arrival date & time: 07/18/2011  3:35 AM   First MD Initiated Contact with Patient 07/18/11 [phone]      Chief Complaint  Patient presents with  . Dizziness    and hypertensive    (Consider location/radiation/quality/duration/timing/severity/associated sxs/prior treatment) HPI Comments: Pt with hx of acute onset of vertigo described as room spinning that occurred just pta when she tried to get out of bed to use the bathroom at her assisted care facility.  Sx were improved with holding still, worse with moving head and assocaited with n/v.  Sx have now resolved.  Deneis f/c/cough/sob, swelling, rash and was feeling normal at dinner last night.  Sx were severe at worst - EMS gave zofran with improvement.  The history is provided by the patient, the EMS personnel and medical records.    Past Medical History  Diagnosis Date  . VHD (valvular heart disease)   . Mitral stenosis   . Mitral regurgitation   . Tricuspid regurgitation   . Pulmonary hypertension   . LVH (left ventricular hypertrophy)   . Fatigue   . History of dizziness   . Leg cramps   . Breast cancer     Past Surgical History  Procedure Date  . Transthoracic echocardiogram 01/12/10    SHOWED SEVERE AORTIC STENOSIS WITH MILD TO MODERATE AORTIC INSUFFICEIENCY.  . Mastectomy     Family History  Problem Relation Age of Onset  . Stroke Mother   . Cancer Father   . Cancer Sister   . Heart disease Brother     History  Substance Use Topics  . Smoking status: Never Smoker   . Smokeless tobacco: Not on file  . Alcohol Use: No    OB History    Grav Para Term Preterm Abortions TAB SAB Ect Mult Living                  Review of Systems  All other systems reviewed and are negative.    Allergies  Darvocet; Levaquin; Lisinopril; and Tequin  Home Medications   Current Outpatient Rx  Name Route Sig Dispense Refill  . CALCIUM CARBONATE 1250 MG PO TABS Oral Take 1 tablet by mouth  daily.      . CENTRUM PO CHEW Oral Chew 1 tablet by mouth daily.      Marland Kitchen NITROGLYCERIN 0.4 MG SL SUBL Sublingual Place 1 tablet (0.4 mg total) under the tongue every 5 (five) minutes as needed for chest pain. 100 tablet 3    BP 184/104  Pulse 108  Temp(Src) 97.5 F (36.4 C) (Oral)  Resp 18  SpO2 94%  Physical Exam  Nursing note and vitals reviewed. Constitutional: She appears well-developed and well-nourished. No distress.  HENT:  Head: Normocephalic and atraumatic.  Mouth/Throat: No oropharyngeal exudate.       MM Mildly dehydrated  Eyes: Conjunctivae and EOM are normal. Pupils are equal, round, and reactive to light. Right eye exhibits no discharge. Left eye exhibits no discharge. No scleral icterus.  Neck: Normal range of motion. Neck supple. No JVD present. No thyromegaly present.  Cardiovascular: Normal rate, regular rhythm and intact distal pulses.  Exam reveals no gallop and no friction rub.   Murmur ( systolic) heard. Pulmonary/Chest: Effort normal and breath sounds normal. No respiratory distress. She has no wheezes. She has no rales.  Abdominal: Soft. Bowel sounds are normal. She exhibits no distension and no mass. There is no tenderness.  Musculoskeletal: Normal range of  motion. She exhibits no edema and no tenderness.  Lymphadenopathy:    She has no cervical adenopathy.  Neurological: She is alert. Coordination normal.       And no inducible nystagmus, normal mental status, normal strength and sensation, double cranial nerves III through XII  Skin: Skin is warm and dry. No rash noted. No erythema.  Psychiatric: She has a normal mood and affect. Her behavior is normal.    ED Course  Procedures (including critical care time)   Labs Reviewed  I-STAT, CHEM 8  URINALYSIS, ROUTINE W REFLEX MICROSCOPIC   No results found.   No diagnosis found.    MDM  Patient has improved significantly during her stay, she currently has no symptoms. Blood work showed slightly  elevated potassium, Kayexalate given, EKG obtained showing no signs of hyperacute T waves or T-wave abnormalities. QRS 100. Will recheck potassium  ED ECG REPORT   Date: 07/18/2011   Rate: 103  Rhythm: normal sinus rhythm  QRS Axis: right  Intervals: normal  ST/T Wave abnormalities: nonspecific T wave changes  Conduction Disutrbances:none  Narrative Interpretation:   Old EKG Reviewed: changes noted T-wave inversions in V1 and V2 have resolved, otherwise no significant changes   Change of shift, care signed out to Dr. Patrica Duel pending repeat potassium level      Vida Roller, MD 07/18/11 [phone]

## 2011-07-18 NOTE — ED Provider Notes (Signed)
8:56 AM  Care assumed from Dr. Hyacinth Meeker pending . Repeat potassium. I-STAT showed a potassium normal at 4.1 with a slightly elevated BUN likely secondary to dehydration. Dr. Hyacinth Meeker was recommending treatment for benign positional vertigo with Zofran and meclizine and outpatient followup. Patient feels well and is amenable. Also, refer to ENT for further valuation as needed.  NA K CL BUN Creatinine, Ser 07/18/11 0842 141 4.1 109 27 (H) 0.90    Kenrick Pore A. Patrica Duel, MD 07/18/11 [id number]

## 2011-07-18 NOTE — ED Notes (Signed)
[facility] called talked to Evangeline Gula concerning an acute bed .She stated that DON makes that decision

## 2011-07-18 NOTE — ED Notes (Signed)
Pt alert, nad, c/o nausea, onset this evening pta, pt from local ECF, EMS est IV, Zofran 4 mg given, resp even unlabored, skin pwd, denies chest pain, denies changes in bowel or bladder, MMM

## 2011-08-01 ENCOUNTER — Ambulatory Visit (INDEPENDENT_AMBULATORY_CARE_PROVIDER_SITE_OTHER): Payer: Medicare HMO | Admitting: Cardiology

## 2011-08-01 ENCOUNTER — Telehealth: Payer: Self-pay | Admitting: Cardiology

## 2011-08-01 ENCOUNTER — Encounter: Payer: Self-pay | Admitting: Cardiology

## 2011-08-01 VITALS — BP 118/70 | HR 66 | Ht 62.0 in | Wt 115.0 lb

## 2011-08-01 DIAGNOSIS — R42 Dizziness and giddiness: Secondary | ICD-10-CM | POA: Insufficient documentation

## 2011-08-01 DIAGNOSIS — R0609 Other forms of dyspnea: Secondary | ICD-10-CM

## 2011-08-01 DIAGNOSIS — I080 Rheumatic disorders of both mitral and aortic valves: Secondary | ICD-10-CM

## 2011-08-01 DIAGNOSIS — R0600 Dyspnea, unspecified: Secondary | ICD-10-CM

## 2011-08-01 DIAGNOSIS — I359 Nonrheumatic aortic valve disorder, unspecified: Secondary | ICD-10-CM

## 2011-08-01 DIAGNOSIS — I350 Nonrheumatic aortic (valve) stenosis: Secondary | ICD-10-CM

## 2011-08-01 NOTE — Assessment & Plan Note (Signed)
No new cardinal symptoms of aortic stenosis.  She is not an operative candidate because of her comorbidities and advanced age and of multi-valvular heart disease.

## 2011-08-01 NOTE — Telephone Encounter (Signed)
Daughter is not bringing her Mom but if Dr. Patty Sermons can please call her and let her know how the visit went. Pt has been put on lopressor and it is making her tired.

## 2011-08-01 NOTE — Telephone Encounter (Signed)
FU Call: pt daughter calling wanting to discuss with Dr. Donzetta Kohut about pt evaluation/visit today. Please return call to discuss further.

## 2011-08-01 NOTE — Assessment & Plan Note (Signed)
The patient has not been experiencing any increase in chronic exertional dyspnea.  She has not been expressing any chest pain or angina.  No exertional dizziness or syncope related to her aortic stenosis.

## 2011-08-01 NOTE — Progress Notes (Signed)
[name] Date of Birth:  [birth year] [facility] / [facility] [address]   [address] [place], [place]  [address] [phone]           Fax   [phone]  History of Present Illness: This 78 year old woman is seen as a work in followup office visit.  She has a history of known multilobular heart disease including severe aortic stenosis until recently she had been on no cardiac medications.  She does have exertional dyspnea and now has home oxygen which she uses as necessary.  On 07/18/11 she was taken to [facility] emergency room where she was treated with vertigo.  She was given a followup appointment for ENT outpatient evaluation scheduled for later this month with Dr. Suszanne Conners.  She now has meclizine on hand for when necessary use.  On 07/25/11 she was noted to have sinus tachycardia at the nursing home and was begun on metoprolol tartrate 25 mg twice a day.  This resolved the tachycardia but the patient has complained to her family that she has been more fatigued.  Current Outpatient Prescriptions  Medication Sig Dispense Refill  . aspirin EC 325 MG tablet Take 325 mg by mouth daily.        . Calcium Carbonate-Vitamin D (CALCIUM-D) 600-400 MG-UNIT TABS Take 1 tablet by mouth daily.        . meclizine (ANTIVERT) 25 MG tablet Take 25 mg by mouth 3 (three) times daily as needed.        . metoprolol tartrate (LOPRESSOR) 25 MG tablet Take 25 mg by mouth as directed. 1/2 tablet twice daily      . Multiple Vitamin (MULITIVITAMIN WITH MINERALS) TABS Take 1 tablet by mouth daily. Centrum       . Multiple Vitamins-Minerals (ICAPS) CAPS Take 1 capsule by mouth daily.        . nitroGLYCERIN (NITROQUICK) 0.4 MG SL tablet Place 1 tablet (0.4 mg total) under the tongue every 5 (five) minutes as needed for chest pain.  100 tablet  3  . polyethylene glycol (MIRALAX / GLYCOLAX) packet Take 17 g by mouth daily as needed. For constipation.       . potassium chloride (K-DUR) 10 MEQ  tablet Take 10 mEq by mouth daily.          Allergies  Allergen Reactions  . Codeine   . Darvocet (Propoxyphene N-Acetaminophen)   . Levaquin   . Lisinopril   . Macrobid   . Tequin     Patient Active Problem List  Diagnoses  . Dyspnea  . Aortic stenosis with mitral and aortic insufficiency  . History of breast cancer  . Epistaxis    History  Smoking status  . Never Smoker   Smokeless tobacco  . Not on file    History  Alcohol Use No    Family History  Problem Relation Age of Onset  . Stroke Mother   . Cancer Father   . Cancer Sister   . Heart disease Brother     Review of Systems: Constitutional: no fever chills diaphoresis or fatigue or change in weight.  Head and neck: no hearing loss, no epistaxis, no photophobia or visual disturbance. Respiratory: No cough, shortness of breath or wheezing. Cardiovascular: No chest pain peripheral edema, palpitations. Gastrointestinal: No abdominal distention, no abdominal pain, no change in bowel habits hematochezia or melena. Genitourinary: No dysuria, no frequency, no urgency, no nocturia. Musculoskeletal:No arthralgias, no back pain, no gait disturbance or myalgias.  Neurological: No dizziness, no headaches, no numbness, no seizures, no syncope, no weakness, no tremors. Hematologic: No lymphadenopathy, no easy bruising. Psychiatric: No confusion, no hallucinations, no sleep disturbance.    Physical Exam: Filed Vitals:   08/01/11 1042  BP: 118/70  Pulse: 66   The general appearance reveals a well-developed alert elderly woman in no distress.Pupils equal and reactive.   Extraocular Movements are full.  There is no scleral icterus.  The mouth and pharynx are normal.  The neck is supple.  The carotids reveal no bruits.  The jugular venous pressure is normal.  The thyroid is not enlarged.  There is no lymphadenopathy.  The chest is clear to percussion and auscultation. There are no rales or rhonchi. Expansion of the  chest is symmetrical.  Heart reveals a grade 2/6 systolic ejection murmur at the base.. no gallop or rub.The abdomen is soft and nontender. Bowel sounds are normal. The liver and spleen are not enlarged. There Are no abdominal masses. There are no bruits.  The pedal pulses are good.  There is no phlebitis or edema.  There is no cyanosis or clubbing. Strength is normal and symmetrical in all extremities.  There is no lateralizing weakness.  There are no sensory deficits.  The skin is warm and dry.  There is no rash.    Assessment / Plan:  her pulse is down to normal now but she is complaining of malaise and fatigue from the Lopressor.  We will reduce her Lopressor dose to just 12.5 mg twice a day .recheck at her regular visit or sooner when necessary

## 2011-08-01 NOTE — Patient Instructions (Signed)
Decrease Metoprolol to 25 mg 1/2 twice daily

## 2011-08-01 NOTE — Telephone Encounter (Signed)
Spoke with daughter and advised of medication change with Metoprolol.  Discussed with daughter and  Dr. Patty Sermons on when patient could go back to her apartment. Per  Dr. Patty Sermons ok to go back whenever Dr Chilton Si feels appropriate.

## 2011-08-01 NOTE — Assessment & Plan Note (Signed)
The patient has not had any recurrence of her symptoms of vertigo.  She does have meclizine on hand if necessary.   She will keep her appointment with ENT

## 2011-08-08 ENCOUNTER — Ambulatory Visit: Payer: Medicare HMO | Admitting: Internal Medicine

## 2011-12-06 ENCOUNTER — Ambulatory Visit: Payer: 59 | Admitting: Cardiology

## 2011-12-27 ENCOUNTER — Encounter: Payer: Self-pay | Admitting: Cardiology

## 2011-12-27 ENCOUNTER — Ambulatory Visit (INDEPENDENT_AMBULATORY_CARE_PROVIDER_SITE_OTHER): Payer: 59 | Admitting: Cardiology

## 2011-12-27 VITALS — BP 170/88 | HR 88 | Ht 62.0 in | Wt 122.0 lb

## 2011-12-27 DIAGNOSIS — R0600 Dyspnea, unspecified: Secondary | ICD-10-CM

## 2011-12-27 DIAGNOSIS — I080 Rheumatic disorders of both mitral and aortic valves: Secondary | ICD-10-CM

## 2011-12-27 DIAGNOSIS — I350 Nonrheumatic aortic (valve) stenosis: Secondary | ICD-10-CM

## 2011-12-27 DIAGNOSIS — R42 Dizziness and giddiness: Secondary | ICD-10-CM

## 2011-12-27 DIAGNOSIS — R0609 Other forms of dyspnea: Secondary | ICD-10-CM

## 2011-12-27 DIAGNOSIS — I359 Nonrheumatic aortic valve disorder, unspecified: Secondary | ICD-10-CM

## 2011-12-27 NOTE — Patient Instructions (Signed)
Your physician recommends that you continue on your current medications as directed. Please refer to the Current Medication list given to you today. Your physician wants you to follow-up in: 4 months You will receive a reminder letter in the mail two months in advance. If you don't receive a letter, please call our office to schedule the follow-up appointment.  

## 2011-12-27 NOTE — Assessment & Plan Note (Signed)
Her dyspnea remains stable and exertional.  She's not having any significant dyspnea at rest

## 2011-12-27 NOTE — Progress Notes (Signed)
[name] Date of Birth:  1915-08-28 [facility] [address] [place], [place]  [address] [phone]         Fax   [phone]  History of Present Illness: This pleasant 78 year old woman is seen for a four-month followup office visit.  She has a history of severe inoperable aortic valve disease.  She has not been experiencing any chest pain.  She does have some exertional dyspnea.  She has not been having any paroxysmal nocturnal dyspnea or peripheral edema.  She still is able to walk with the help of a walker.  All of her friends at the rest home have died so she tends to take a lot of for meals in her room rather than walking to the dining hall.  Her son gave her a motorized scooter but she has not been using it.  She's been having a lot of problems with vertigo and Dr. Murray Hodgkins has given her a referral for an appointment to see neurologist Dr. Terrace Arabia.  Current Outpatient Prescriptions  Medication Sig Dispense Refill  . Calcium Carbonate-Vitamin D (CALCIUM-D) 600-400 MG-UNIT TABS Take 1 tablet by mouth daily.        . meclizine (ANTIVERT) 25 MG tablet Take 25 mg by mouth 3 (three) times daily as needed.        . metoprolol tartrate (LOPRESSOR) 25 MG tablet Take 25 mg by mouth as directed. 1/2 tablet twice daily      . Multiple Vitamin (MULITIVITAMIN WITH MINERALS) TABS Take 1 tablet by mouth daily. Centrum       . Multiple Vitamins-Minerals (ICAPS) CAPS Take 1 capsule by mouth daily.        . polyethylene glycol (MIRALAX / GLYCOLAX) packet Take 17 g by mouth daily as needed. For constipation.       . potassium chloride (K-DUR) 10 MEQ tablet Take 10 mEq by mouth daily.        . nitroGLYCERIN (NITROQUICK) 0.4 MG SL tablet Place 1 tablet (0.4 mg total) under the tongue every 5 (five) minutes as needed for chest pain.  100 tablet  3    Allergies  Allergen Reactions  . Codeine   . Darvocet (Propoxyphene-Acetaminophen)   . Levofloxacin   . Lisinopril   .  Nitrofurantoin Monohyd Macro   . Tequin     Patient Active Problem List  Diagnoses  . Dyspnea  . Aortic stenosis with mitral and aortic insufficiency  . History of breast cancer  . Epistaxis  . Vertigo    History  Smoking status  . Never Smoker   Smokeless tobacco  . Not on file    History  Alcohol Use No    Family History  Problem Relation Age of Onset  . Stroke Mother   . Cancer Father   . Cancer Sister   . Heart disease Brother     Review of Systems: Constitutional: no fever chills diaphoresis or fatigue or change in weight.  Head and neck: no hearing loss, no epistaxis, no photophobia or visual disturbance. Respiratory: No cough, shortness of breath or wheezing. Cardiovascular: No chest pain peripheral edema, palpitations. Gastrointestinal: No abdominal distention, no abdominal pain, no change in bowel habits hematochezia or melena. Genitourinary: No dysuria, no frequency, no urgency, no nocturia. Musculoskeletal:No arthralgias, no back pain, no gait disturbance or myalgias. Neurological: No dizziness, no headaches, no numbness, no seizures, no syncope, no weakness, no tremors. Hematologic: No lymphadenopathy, no easy bruising. Psychiatric: No confusion, no hallucinations, no sleep  disturbance.    Physical Exam: Filed Vitals:   12/27/11 1657  BP: 170/88  Pulse: 88   the general appearance reveals a well-developed well-nourished woman who appears younger than her stated age.Pupils equal and reactive.   Extraocular Movements are full.  There is no scleral icterus.  There is no nystagmus seen today.  The mouth and pharynx are normal.  The neck is supple.  The carotids reveal no bruits.  The jugular venous pressure is normal.  The thyroid is not enlarged.  There is no lymphadenopathy.The chest is clear to percussion and auscultation. There are no rales or rhonchi. Expansion of the chest is symmetrical.  The heart reveals a grade 3/6 harsh systolic ejection murmur  loudest at the base.The abdomen is soft and nontender. Bowel sounds are normal. The liver and spleen are not enlarged. There Are no abdominal masses. There are no bruits.  The pedal pulses are good.  There is no phlebitis or edema.  There is no cyanosis or clubbing. Strength is normal and symmetrical in all extremities.  There is no lateralizing weakness.  There are no sensory deficits.  The skin is warm and dry.  There is no rash.      Assessment / Plan: Continue same medication.  Continue to walk as much as possible.  I encouraged her to keep the appointment with Dr. Terrace Arabia concerning her vertigo.  Recheck here in 4 months for followup office visit and EKG

## 2011-12-27 NOTE — Assessment & Plan Note (Signed)
The patient has had a history of vertigo dating back 20 years.  She had a severe episode in December 2012 and had to go to [facility] emergency room.  She uses meclizine on a when necessary basis

## 2011-12-27 NOTE — Assessment & Plan Note (Signed)
The patient has a history of multi-valvular heart disease with severe aortic stenosis.  She is not an operative candidate.

## 2012-01-06 ENCOUNTER — Telehealth: Payer: Self-pay | Admitting: Cardiology

## 2012-01-06 NOTE — Telephone Encounter (Signed)
New msg Pt's daughter wants to know list of her meds.

## 2012-01-06 NOTE — Telephone Encounter (Signed)
Mailed med list to daughter as requested

## 2012-01-10 ENCOUNTER — Other Ambulatory Visit: Payer: Self-pay | Admitting: Neurology

## 2012-01-10 DIAGNOSIS — R42 Dizziness and giddiness: Secondary | ICD-10-CM

## 2012-01-10 DIAGNOSIS — R269 Unspecified abnormalities of gait and mobility: Secondary | ICD-10-CM

## 2012-01-14 ENCOUNTER — Other Ambulatory Visit: Payer: 59

## 2012-01-22 ENCOUNTER — Ambulatory Visit
Admission: RE | Admit: 2012-01-22 | Discharge: 2012-01-22 | Disposition: A | Discharge status: Home / Self Care | Payer: 59 | Source: Ambulatory Visit | Attending: Neurology | Admitting: Neurology

## 2012-01-22 DIAGNOSIS — R269 Unspecified abnormalities of gait and mobility: Secondary | ICD-10-CM

## 2012-01-22 DIAGNOSIS — R42 Dizziness and giddiness: Secondary | ICD-10-CM

## 2012-05-21 ENCOUNTER — Encounter: Payer: Self-pay | Admitting: Cardiology

## 2012-05-21 ENCOUNTER — Ambulatory Visit (INDEPENDENT_AMBULATORY_CARE_PROVIDER_SITE_OTHER): Payer: 59 | Admitting: Cardiology

## 2012-05-21 VITALS — BP 156/64 | HR 95 | Ht 62.0 in | Wt 121.8 lb

## 2012-05-21 DIAGNOSIS — R42 Dizziness and giddiness: Secondary | ICD-10-CM

## 2012-05-21 DIAGNOSIS — Z853 Personal history of malignant neoplasm of breast: Secondary | ICD-10-CM

## 2012-05-21 DIAGNOSIS — I080 Rheumatic disorders of both mitral and aortic valves: Secondary | ICD-10-CM

## 2012-05-21 NOTE — Assessment & Plan Note (Signed)
Patient has a remote history of bilateral breast cancer.  There has been no clinical evidence of recurrence

## 2012-05-21 NOTE — Assessment & Plan Note (Signed)
The patient is limited by her valvular heart disease in that she cannot walk rapidly.  She does okay as long as she takes her time and does not get in a rush.  He has not been expressing any chest pain or angina

## 2012-05-21 NOTE — Patient Instructions (Addendum)
Your physician recommends that you continue on your current medications as directed. Please refer to the Current Medication list given to you today.  Your physician wants you to follow-up in: 6 months. You will receive a reminder letter in the mail two months in advance. If you don't receive a letter, please call our office to schedule the follow-up appointment.  

## 2012-05-21 NOTE — Assessment & Plan Note (Signed)
Her vertigo has been mild.  She has not had to take many meclizines.

## 2012-05-21 NOTE — Progress Notes (Signed)
[name] Date of Birth:  [birth year] [facility] [address] [place], [place]  [address] [phone]         Fax   [phone]  History of Present Illness: This pleasant 78 year old woman is seen for a four-month followup office visit. She has a history of severe inoperable aortic valve disease. She has not been experiencing any chest pain. She does have some exertional dyspnea. She has not been having any paroxysmal nocturnal dyspnea or peripheral edema. She still is able to walk with the help of a walker. All of her friends at the rest home have died so she tends to take a lot of for meals in her room rather than walking to the dining hall. Her son gave her a motorized scooter but she has not been using it. She's been having a lot of problems with vertigo and she has meclizine on hand for when necessary use.  Since we last saw her she has been diagnosed with macular degeneration in both eyes.  Dr. Dagoberto Ligas is her ophthalmologist.   Current Outpatient Prescriptions  Medication Sig Dispense Refill  . Calcium Carbonate-Vitamin D (CALCIUM-D) 600-400 MG-UNIT TABS Take 1 tablet by mouth daily.        . meclizine (ANTIVERT) 25 MG tablet Take 25 mg by mouth 3 (three) times daily as needed.        . metoprolol tartrate (LOPRESSOR) 25 MG tablet Take 25 mg by mouth as directed. 1/2 tablet twice daily      . Multiple Vitamin (MULITIVITAMIN WITH MINERALS) TABS Take 1 tablet by mouth daily. Centrum       . Multiple Vitamins-Minerals (ICAPS) CAPS Take 1 capsule by mouth daily.        . nitroGLYCERIN (NITROQUICK) 0.4 MG SL tablet Place 1 tablet (0.4 mg total) under the tongue every 5 (five) minutes as needed for chest pain.  100 tablet  3  . polyethylene glycol (MIRALAX / GLYCOLAX) packet Take 17 g by mouth daily as needed. For constipation.       . potassium chloride (K-DUR) 10 MEQ tablet Take 10 mEq by mouth daily.          Allergies  Allergen Reactions  . Codeine   .  Darvocet (Propoxyphene-Acetaminophen)   . Levofloxacin   . Lisinopril   . Nitrofurantoin Monohyd Macro   . Tequin     Patient Active Problem List  Diagnosis  . Dyspnea  . Aortic stenosis with mitral and aortic insufficiency  . History of breast cancer  . Epistaxis  . Vertigo    History  Smoking status  . Never Smoker   Smokeless tobacco  . Not on file    History  Alcohol Use No    Family History  Problem Relation Age of Onset  . Stroke Mother   . Cancer Father   . Cancer Sister   . Heart disease Brother     Review of Systems: Constitutional: no fever chills diaphoresis or fatigue or change in weight.  Head and neck: no hearing loss, no epistaxis, no photophobia or visual disturbance. Respiratory: No cough, shortness of breath or wheezing. Cardiovascular: No chest pain peripheral edema, palpitations. Gastrointestinal: No abdominal distention, no abdominal pain, no change in bowel habits hematochezia or melena. Genitourinary: No dysuria, no frequency, no urgency, no nocturia. Musculoskeletal:No arthralgias, no back pain, no gait disturbance or myalgias. Neurological: No dizziness, no headaches, no numbness, no seizures, no syncope, no weakness, no tremors. Hematologic: No lymphadenopathy, no easy  bruising. Psychiatric: No confusion, no hallucinations, no sleep disturbance.    Physical Exam: Filed Vitals:   05/21/12 1043  BP: 156/64  Pulse: 95   the general appearance reveals a well-developed well-nourished elderly woman who appears younger than her stated age..The head and neck exam reveals pupils equal and reactive.  Extraocular movements are full.  There is no scleral icterus.  The mouth and pharynx are normal.  The neck is supple.  The carotids reveal no bruits.  The jugular venous pressure is normal.  The  thyroid is not enlarged.  There is no lymphadenopathy.  The chest is clear to percussion and auscultation.  There are no rales or rhonchi.  Expansion of the  chest is symmetrical.  The precordium is quiet.  The first heart sound is normal.  The second heart sound is physiologically split.  There is no  gallop rub or click.  There is a grade 2/6 harsh systolic ejection murmur at the base. There is no abnormal lift or heave.  The abdomen is soft and nontender.  The bowel sounds are normal.  The liver and spleen are not enlarged.  There are no abdominal masses.  There are no abdominal bruits.  Extremities reveal good pedal pulses.  There is no phlebitis or edema.  There is a resolving laceration on the ventral surface of the right foot without evidence of cellulitis or localized infection. There is no cyanosis or clubbing.  Strength is normal and symmetrical in all extremities.  There is no lateralizing weakness.  There are no sensory deficits.  The skin is warm and dry.  There is no rash.   EKG today shows normal sinus rhythm with occasional PACs and nonspecific ST-T wave abnormalities  Assessment / Plan:  continue same medication.  Recheck in 6 months for followup office visit

## 2012-06-07 ENCOUNTER — Emergency Department (HOSPITAL_COMMUNITY): Payer: Medicare PPO

## 2012-06-07 ENCOUNTER — Encounter (HOSPITAL_COMMUNITY): Payer: Self-pay | Admitting: Emergency Medicine

## 2012-06-07 ENCOUNTER — Observation Stay (HOSPITAL_COMMUNITY)
Admission: EM | Admit: 2012-06-07 | Discharge: 2012-06-09 | Disposition: A | Discharge status: Home / Self Care | Payer: Medicare PPO | Attending: Internal Medicine | Admitting: Internal Medicine

## 2012-06-07 DIAGNOSIS — Z853 Personal history of malignant neoplasm of breast: Secondary | ICD-10-CM

## 2012-06-07 DIAGNOSIS — R0600 Dyspnea, unspecified: Secondary | ICD-10-CM

## 2012-06-07 DIAGNOSIS — R110 Nausea: Secondary | ICD-10-CM | POA: Insufficient documentation

## 2012-06-07 DIAGNOSIS — I359 Nonrheumatic aortic valve disorder, unspecified: Secondary | ICD-10-CM | POA: Insufficient documentation

## 2012-06-07 DIAGNOSIS — R040 Epistaxis: Secondary | ICD-10-CM

## 2012-06-07 DIAGNOSIS — R42 Dizziness and giddiness: Principal | ICD-10-CM

## 2012-06-07 DIAGNOSIS — I671 Cerebral aneurysm, nonruptured: Secondary | ICD-10-CM

## 2012-06-07 DIAGNOSIS — R079 Chest pain, unspecified: Secondary | ICD-10-CM

## 2012-06-07 DIAGNOSIS — I059 Rheumatic mitral valve disease, unspecified: Secondary | ICD-10-CM | POA: Insufficient documentation

## 2012-06-07 DIAGNOSIS — Z9010 Acquired absence of unspecified breast and nipple: Secondary | ICD-10-CM | POA: Insufficient documentation

## 2012-06-07 DIAGNOSIS — I079 Rheumatic tricuspid valve disease, unspecified: Secondary | ICD-10-CM | POA: Insufficient documentation

## 2012-06-07 DIAGNOSIS — Z23 Encounter for immunization: Secondary | ICD-10-CM | POA: Insufficient documentation

## 2012-06-07 DIAGNOSIS — R262 Difficulty in walking, not elsewhere classified: Secondary | ICD-10-CM | POA: Insufficient documentation

## 2012-06-07 DIAGNOSIS — G9389 Other specified disorders of brain: Secondary | ICD-10-CM

## 2012-06-07 DIAGNOSIS — I080 Rheumatic disorders of both mitral and aortic valves: Secondary | ICD-10-CM

## 2012-06-07 MED ORDER — ONDANSETRON HCL 4 MG/2ML IJ SOLN
4.0000 mg | Freq: Once | INTRAMUSCULAR | Status: AC
  Administered 2012-06-07: 4 mg via INTRAVENOUS
  Filled 2012-06-07: qty 2

## 2012-06-07 MED ORDER — LORAZEPAM 0.5 MG PO TABS: 0.5000 mg | ORAL_TABLET | Freq: Once | ORAL | Status: DC

## 2012-06-07 MED ORDER — MECLIZINE HCL 25 MG PO TABS
25.0000 mg | ORAL_TABLET | Freq: Once | ORAL | Status: AC
  Administered 2012-06-07: 25 mg via ORAL
  Filled 2012-06-07: qty 1

## 2012-06-07 MED ORDER — SODIUM CHLORIDE 0.9 % IV SOLN
INTRAVENOUS | Status: DC
  Administered 2012-06-07: via INTRAVENOUS

## 2012-06-07 NOTE — ED Notes (Addendum)
Patient states in Dec 2012 she was diagnosed with vertigo. She was given an rx for Meclozine, but has run out. Patient unable to recall last time she took the medication. Patient had been told anytime she was dizzy to take the Meclazine. Patient states dizziness increased yesterday that it effected her ADLs, and today it continued to increase to the point that she called EMS. Patient denies pain or any other complaints. Patient reports one episode of emesis at approx 0800, when attempting to eat breakfast. Patient states she has not eaten anything else today.

## 2012-06-07 NOTE — ED Notes (Signed)
Patient daughter Melburn Popper) request a phone call when more information is available. She is available at [phone]. Daughter also requesting patient be sent to "[facility] at [facility]" when discharged, as patient is currently in Independent Living.

## 2012-06-07 NOTE — ED Notes (Signed)
Patient asked for her bag from home to look for cell phone. While looking for cell phone a Rx bottle fell out. The bottle is Meclazine. Patient bottle has approx 20 pills.

## 2012-06-07 NOTE — ED Notes (Signed)
Patient daughter on phone. Daughter states patient saw Dr. Debarah Crape at [facility] neurology about 2 months ago and it is her opinion the patient is having small vascular events that are causing the dizziness and she should not take the meclezine.

## 2012-06-07 NOTE — ED Notes (Signed)
Per EMS. Patient has hx of vertigo. Dizziness began yesterday, and increased throughout the day today.

## 2012-06-08 ENCOUNTER — Encounter (HOSPITAL_COMMUNITY): Payer: Self-pay | Admitting: Internal Medicine

## 2012-06-08 ENCOUNTER — Observation Stay (HOSPITAL_COMMUNITY): Payer: Medicare PPO

## 2012-06-08 DIAGNOSIS — R0989 Other specified symptoms and signs involving the circulatory and respiratory systems: Secondary | ICD-10-CM

## 2012-06-08 DIAGNOSIS — G939 Disorder of brain, unspecified: Secondary | ICD-10-CM

## 2012-06-08 DIAGNOSIS — R42 Dizziness and giddiness: Secondary | ICD-10-CM

## 2012-06-08 DIAGNOSIS — I080 Rheumatic disorders of both mitral and aortic valves: Secondary | ICD-10-CM

## 2012-06-08 DIAGNOSIS — R079 Chest pain, unspecified: Secondary | ICD-10-CM

## 2012-06-08 DIAGNOSIS — I671 Cerebral aneurysm, nonruptured: Secondary | ICD-10-CM | POA: Diagnosis present

## 2012-06-08 DIAGNOSIS — D329 Benign neoplasm of meninges, unspecified: Secondary | ICD-10-CM | POA: Insufficient documentation

## 2012-06-08 LAB — CBC WITH DIFFERENTIAL/PLATELET
Basophils Absolute: 0 10*3/uL (ref 0.0–0.1)
Lymphocytes Relative: 15 % (ref 12–46)
Lymphs Abs: 1.1 10*3/uL (ref 0.7–4.0)
Neutro Abs: 5.2 10*3/uL (ref 1.7–7.7)
Neutrophils Relative %: 71 % (ref 43–77)
Platelets: 162 10*3/uL (ref 150–400)
RBC: 5.03 MIL/uL (ref 3.87–5.11)
RDW: 14.4 % (ref 11.5–15.5)
WBC: 7.3 10*3/uL (ref 4.0–10.5)

## 2012-06-08 LAB — COMPREHENSIVE METABOLIC PANEL
ALT: 13 U/L (ref 0–35)
AST: 29 U/L (ref 0–37)
Albumin: 3.4 g/dL — ABNORMAL LOW (ref 3.5–5.2)
Alkaline Phosphatase: 97 U/L (ref 39–117)
Alkaline Phosphatase: 95 U/L (ref 39–117)
BUN: 16 mg/dL (ref 6–23)
CO2: 25 mEq/L (ref 19–32)
Chloride: 102 mEq/L (ref 96–112)
Chloride: 101 mEq/L (ref 96–112)
Creatinine, Ser: 0.88 mg/dL (ref 0.50–1.10)
GFR calc Af Amer: 63 mL/min — ABNORMAL LOW (ref >90)
GFR calc non Af Amer: 55 mL/min — ABNORMAL LOW (ref >90)
Glucose, Bld: 79 mg/dL (ref 70–99)
Potassium: 4.3 mEq/L (ref 3.5–5.1)
Potassium: 4 mEq/L (ref 3.5–5.1)
Sodium: 137 mEq/L (ref 135–145)
Total Bilirubin: 0.7 mg/dL (ref 0.3–1.2)
Total Bilirubin: 0.7 mg/dL (ref 0.3–1.2)

## 2012-06-08 LAB — CBC
Hemoglobin: 14.6 g/dL (ref 12.0–15.0)
MCH: 29.3 pg (ref 26.0–34.0)
MCHC: 33.6 g/dL (ref 30.0–36.0)
Platelets: 191 10*3/uL (ref 150–400)

## 2012-06-08 LAB — TROPONIN I: Troponin I: <0.3 ng/mL (ref <0.30)

## 2012-06-08 LAB — POCT I-STAT, CHEM 8
Chloride: 103 mEq/L (ref 96–112)
Glucose, Bld: 98 mg/dL (ref 70–99)
HCT: 45 % (ref 36.0–46.0)
Potassium: 5.9 mEq/L — ABNORMAL HIGH (ref 3.5–5.1)
Sodium: 136 mEq/L (ref 135–145)

## 2012-06-08 MED ORDER — ONDANSETRON HCL 4 MG PO TABS: 4.0000 mg | ORAL_TABLET | Freq: Four times a day (QID) | ORAL | Status: DC | PRN

## 2012-06-08 MED ORDER — ASPIRIN EC 325 MG PO TBEC
325.0000 mg | DELAYED_RELEASE_TABLET | Freq: Every day | ORAL | Status: DC
  Administered 2012-06-08 – 2012-06-09 (×2): 325 mg via ORAL
  Filled 2012-06-08 (×2): qty 1

## 2012-06-08 MED ORDER — ONDANSETRON HCL 4 MG/2ML IJ SOLN: 4.0000 mg | Freq: Four times a day (QID) | INTRAMUSCULAR | Status: DC | PRN

## 2012-06-08 MED ORDER — ACETAMINOPHEN 325 MG PO TABS: 650.0000 mg | ORAL_TABLET | Freq: Four times a day (QID) | ORAL | Status: DC | PRN

## 2012-06-08 MED ORDER — ACETAMINOPHEN 650 MG RE SUPP: 650.0000 mg | Freq: Four times a day (QID) | RECTAL | Status: DC | PRN

## 2012-06-08 MED ORDER — NITROGLYCERIN 0.4 MG SL SUBL: 0.4000 mg | SUBLINGUAL_TABLET | SUBLINGUAL | Status: DC | PRN

## 2012-06-08 MED ORDER — INFLUENZA VIRUS VACC SPLIT PF IM SUSP
0.5000 mL | INTRAMUSCULAR | Status: AC
  Administered 2012-06-09: 0.5 mL via INTRAMUSCULAR
  Filled 2012-06-08: qty 0.5

## 2012-06-08 MED ORDER — SODIUM CHLORIDE 0.9 % IJ SOLN
3.0000 mL | Freq: Two times a day (BID) | INTRAMUSCULAR | Status: DC
  Administered 2012-06-08 (×2): 3 mL via INTRAVENOUS

## 2012-06-08 MED ORDER — MECLIZINE HCL 12.5 MG PO TABS
12.5000 mg | ORAL_TABLET | Freq: Two times a day (BID) | ORAL | Status: DC
  Administered 2012-06-09: 12.5 mg via ORAL
  Filled 2012-06-08 (×4): qty 1

## 2012-06-08 MED ORDER — SODIUM CHLORIDE 0.9 % IJ SOLN: 3.0000 mL | Freq: Two times a day (BID) | INTRAMUSCULAR | Status: DC

## 2012-06-08 MED ORDER — METOPROLOL TARTRATE 12.5 MG HALF TABLET
12.5000 mg | ORAL_TABLET | Freq: Two times a day (BID) | ORAL | Status: DC
  Administered 2012-06-08 – 2012-06-09 (×3): 12.5 mg via ORAL
  Filled 2012-06-08 (×4): qty 1

## 2012-06-08 NOTE — Evaluation (Signed)
Physical Therapy Evaluation Patient Details Name: [name] MRN: [id number] DOB: 03-13-1916 Today's Date: 06/08/2012 Time: 1478-2956 PT Time Calculation (min): 42 min  PT Assessment / Plan / Recommendation Clinical Impression  Patient is a 78 yo female admitted with dizziness and weakness. Minimal  dizziness with movement today.  Patient with difficulty following directions for occulomotor assessment.  Difficulty with smooth pursuits and saccades.  Left eye movements not smooth - at times disconjugate.  No dizziness with rolling.  Minimal dizziness with gait. Dizziness not associated with specific movements.  Question more central cause for dizziness.  Patient will benefit from acute PT to maximize independence prior to discharge.  Recommend SNF at discharge for continued therapy.    PT Assessment  Patient needs continued PT services    Follow Up Recommendations  SNF    Does the patient have the potential to tolerate intense rehabilitation      Barriers to Discharge None      Equipment Recommendations  None recommended by PT    Recommendations for Other Services     Frequency Min 3X/week    Precautions / Restrictions Precautions Precautions: Fall Restrictions Weight Bearing Restrictions: No   Pertinent Vitals/Pain       Mobility  Bed Mobility Bed Mobility: Supine to Sit;Sitting - Scoot to Edge of Bed;Sit to Supine Supine to Sit: 4: Min assist;With rails;HOB elevated Sitting - Scoot to Edge of Bed: 4: Min assist;With rail Sit to Supine: 3: Mod assist;HOB elevated;With rail Details for Bed Mobility Assistance: Verbal cues for technique.  Assist to move LE's onto bed when returning to supine.  Patient with dizziness when moving to sittine 2/10. Transfers Transfers: Sit to Stand;Stand to Sit Sit to Stand: 4: Min assist;With upper extremity assist;From bed Stand to Sit: 4: Min assist;With upper extremity assist;To bed Details for Transfer Assistance: Verbal cues for hand  placement.  Assist to initiate standing.  Patient unable to problem solve how to back up to bed before sitting.  Needed mod verbal cuing. Ambulation/Gait Ambulation/Gait Assistance: 4: Min assist Ambulation Distance (Feet): 175 Feet Assistive device: Rolling walker Ambulation/Gait Assistance Details: Verbal cues to keep RW close to body, stand upright, and look forward during gait.  Patient lost balance .3 during gait, needing assist to regain balance. Gait Pattern: Step-through pattern;Decreased stride length;Ataxic;Trunk flexed Gait velocity: Slow gait speed           PT Diagnosis: Difficulty walking;Abnormality of gait;Generalized weakness;Altered mental status  PT Problem List: Decreased strength;Decreased activity tolerance;Decreased balance;Decreased mobility;Decreased cognition;Decreased knowledge of use of DME PT Treatment Interventions: DME instruction;Gait training;Functional mobility training;Cognitive remediation;Patient/family education   PT Goals Acute Rehab PT Goals PT Goal Formulation: With patient/family Time For Goal Achievement: 06/22/12 Potential to Achieve Goals: Good Pt will go Supine/Side to Sit: with supervision PT Goal: Supine/Side to Sit - Progress: Goal set today Pt will go Sit to Supine/Side: with supervision PT Goal: Sit to Supine/Side - Progress: Goal set today Pt will go Sit to Stand: with supervision PT Goal: Sit to Stand - Progress: Goal set today Pt will Ambulate: >150 feet;with supervision;with rolling walker PT Goal: Ambulate - Progress: Goal set today  Visit Information  Last PT Received On: 06/08/12 Assistance Needed: +1    Subjective Data  Subjective: Very talkative. Patient Stated Goal: To be able to [facility] to [facility].   Prior Functioning  Home Living Lives With: Alone ([facility]) Available Help at Discharge: Skilled Nursing Facility Home Adaptive Equipment: Dan Humphreys - four  wheeled Prior Function Level of  Independence: Independent with assistive device(s);Needs assistance Needs Assistance: Meal Prep;Light Housekeeping Meal Prep: Maximal Light Housekeeping: Total Able to Take Stairs?: No Driving: No Vocation: Retired Musician: No difficulties    Cognition  Overall Cognitive Status: Impaired Area of Impairment: Attention;Memory;Safety/judgement;Awareness of deficits Arousal/Alertness: Awake/alert Orientation Level: Disoriented to;Place;Situation Behavior During Session: Anxious Current Attention Level: Selective Memory Deficits: Unable to recall name of ILF.  Gave incorrect information about prior functional status (daughter corrected). Safety/Judgement: Decreased awareness of need for assistance;Decreased safety judgement for tasks assessed Safety/Judgement - Other Comments: Patient thinking she can return to ILF.  Discussed with her that she will need more assist now. Awareness of Deficits: Does not recognize her decreased balance. Problem Solving: Difficulty figuring out how to back up to bed before sitting.  Needed mod verbal cuing.    Extremity/Trunk Assessment Right Upper Extremity Assessment RUE ROM/Strength/Tone: WFL for tasks assessed Left Upper Extremity Assessment LUE ROM/Strength/Tone: WFL for tasks assessed Right Lower Extremity Assessment RLE ROM/Strength/Tone: Deficits RLE ROM/Strength/Tone Deficits: Strength 4-/5 RLE Sensation: WFL - Light Touch Left Lower Extremity Assessment LLE ROM/Strength/Tone: Deficits LLE ROM/Strength/Tone Deficits: Strength 4-/5 Trunk Assessment Trunk Assessment: Kyphotic   Balance Balance Balance Assessed: Yes Static Sitting Balance Static Sitting - Balance Support: Bilateral upper extremity supported;Feet unsupported Static Sitting - Level of Assistance: 4: Min assist Static Sitting - Comment/# of Minutes: Patient able to sit at EOB x 6 minutes.  Patient leaning posteriorly and to left, requiring min assist to regain  balance.  End of Session PT - End of Session Equipment Utilized During Treatment: Gait belt Activity Tolerance: Patient limited by fatigue Patient left: in bed;with call bell/phone within reach;with family/visitor present (HOB elevated to upright) Nurse Communication: Mobility status  GP Functional Assessment Tool Used: Clinical judgement Functional Limitation: Mobility: Walking and moving around Mobility: Walking and Moving Around Current Status (Z6109): At least 20 percent but less than 40 percent impaired, limited or restricted Mobility: Walking and Moving Around Goal Status [phone]): At least 1 percent but less than 20 percent impaired, limited or restricted   Vena Austria 06/08/2012, 9:01 PM Durenda Hurt. Renaldo Fiddler, [facility] Acute Rehab Services Pager [phone]

## 2012-06-08 NOTE — ED Notes (Signed)
Patient transported to CT 

## 2012-06-08 NOTE — Progress Notes (Signed)
Patient ID: [name]  female  UJW:[id number]    DOB: 1916-03-06    DOA: 06/07/2012  PCP: No primary provider on file.  Assessment/Plan: Principal Problem:  *Dizziness/vertigo - MRI brain consistent with 3x 3x 2.9cm lesion in left cavernous sinus region. - start meclizine   Active Problems: Brain aneurysm: - Neurosurgery on board, Dr Danielle Dess to see the patient    Aortic stenosis with mitral and aortic insufficiency, inoperable - Holding off lasix for now    History of breast cancer s/p bilateral mastectomy   DVT Prophylaxis:  Code Status: Full  Disposition: PT eval, await NSx eval    Subjective: Dizziness improving, HA improving, had blurred vision   Objective: Weight change:  No intake or output data in the 24 hours ending 06/08/12 1354 Blood pressure 154/77, pulse 96, temperature 97.8 F (36.6 C), temperature source Oral, resp. rate 20, height 5\' 2"  (1.575 m), weight 54.4 kg (119 lb 14.9 oz), SpO2 95.00%.  Physical Exam: General: Alert and awake, oriented x3, not in any acute distress. HEENT: anicteric sclera, nystagmus, PERLA, EOMI CVS: S1-S2 clear, + murmur  Chest: clear to auscultation bilaterally, no wheezing, rales or rhonchi Abdomen: soft nontender, nondistended, normal bowel sounds, no organomegaly Extremities: no cyanosis, clubbing or edema noted bilaterally   Lab Results: Basic Metabolic Panel:  Lab 06/08/12 5621 06/08/12 0348  NA 137 137  K 4.0 4.3  CL 101 102  CO2 23 25  GLUCOSE 79 91  BUN 16 16  CREATININE 0.88 0.87  CALCIUM 9.8 9.5  MG -- --  PHOS -- --   Liver Function Tests:  Lab 06/08/12 0920 06/08/12 0348  AST 29 29  ALT 12 13  ALKPHOS 95 97  BILITOT 0.7 0.7  PROT 7.1 7.0  ALBUMIN 3.4* 3.4*   No results found for this basename: LIPASE:2,AMYLASE:2 in the last 168 hours No results found for this basename: AMMONIA:2 in the last 168 hours CBC:  Lab 06/08/12 0920 06/08/12 0013 06/07/12 2352  WBC 7.3 -- 7.4  NEUTROABS 5.2 -- --  HGB  14.8 15.3* --  HCT 44.2 45.0 --  MCV 87.9 -- 87.0  PLT 162 -- 191   Cardiac Enzymes:  Lab 06/08/12 0920  CKTOTAL --  CKMB --  CKMBINDEX --  TROPONINI <0.30    Studies/Results: Ct Head Wo Contrast  06/08/2012  *RADIOLOGY REPORT*  Clinical Data: Dizziness.  CT HEAD WITHOUT CONTRAST  Technique:  Contiguous axial images were obtained from the base of the skull through the vertex without contrast.  Comparison: MRI 01/22/2012  Findings: There is atrophy and chronic small vessel disease changes.  Large extra-axial mass noted along the medial left temporal lobe.  This is unchanged since prior MRI.  This demonstrates partial rim calcification.  This measures 3.0 x 1.8 cm. There is extension into the posterior aspect of the left sphenoid sinus.  No hemorrhage.  No acute infarction.  No midline shift.  No acute calvarial abnormality.  IMPRESSION: Stable large extra-axial mass along the medial left temporal lobe. Favor meningioma although a large aneurysm cannot be excluded.  No hemorrhage or acute infarction.  Atrophy, chronic small vessel disease.   Original Report Authenticated By: Charlett Nose, M.D.    Mr Brain Wo Contrast  06/08/2012  *RADIOLOGY REPORT*  Clinical Data: Persistent vertigo.  Rule out infarct.  MRI HEAD WITHOUT CONTRAST  Technique:  Multiplanar, multiecho pulse sequences of the brain and surrounding structures were obtained according to standard protocol without intravenous contrast.  Comparison: 06/08/2012  CT.  01/22/2012 MR.  Findings: 3 x 3 x 2.9 cm lesion centered in the left cavernous sinus region (with remodeling of adjacent bony structures) has an appearance most suggestive of a giant aneurysm (which may be partially thrombosed) and is only minimally change compared to the 01/22/2012 examination when this measured 3 x 2.9 x 2.8 cm.  This compresses the anterior medial aspect of the left temporal lobe of. No associated vasogenic edema.  This appears longstanding and would be better  assessed by CT angiogram or MR angiogram.  No acute infarct.  Left cerebellar blood breakdown products may represent result of prior hemorrhagic ischemia, small cavernoma or result of prior trauma.  There is otherwise no evidence of intracranial hemorrhage.  Prominent small vessel disease type changes.  Remote small bilateral cerebellar infarcts.  Remote small basal ganglia and tiny thalamic infarcts.  Global atrophy.  Ventricular prominence may be related to atrophy although difficult to completely exclude mild component hydrocephalus.  Right sphenoid sinus opacification with small air-fluid level.  Major intracranial vascular structures are patent.  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  No acute infarct.  Remote infarcts and small vessel disease type changes as noted above.  Atrophy with ventricular prominence.  This has been made a PRA call report utilizing dashboard call feature.   Original Report Authenticated By: Lacy Duverney, M.D.     Medications: Scheduled Meds:   . aspirin EC  325 mg Oral Daily  . influenza  inactive virus vaccine  0.5 mL Intramuscular Tomorrow-1000  . [COMPLETED] meclizine  25 mg Oral Once  . metoprolol tartrate  12.5 mg Oral BID  . [COMPLETED] ondansetron  4 mg Intravenous Once  . sodium chloride  3 mL Intravenous Q12H  . [DISCONTINUED] LORazepam  0.5 mg Oral Once  . [DISCONTINUED] sodium chloride  3 mL Intravenous Q12H      LOS: 1 day   Preslei Blakley M.D. [facility] 06/08/2012, 1:54 PM Pager: [phone]  If 7PM-7AM, please contact night-coverage [email or web address] Password [email or web address]

## 2012-06-08 NOTE — ED Notes (Signed)
Patient states laying down she feels "better", and that dizziness is slightly decreased. Patient unsure about ambulating. She states she is afraid she will fall.

## 2012-06-08 NOTE — ED Notes (Signed)
[facility] called for transfer 

## 2012-06-08 NOTE — H&P (Addendum)
[name] is an 78 y.o. female.   Patient was seen and examined on June 08, 2012. PCP -Dr. Murray Hodgkins.  Chief Complaint: Dizziness.  HPI: 78 year old female with history of severe inoperable aortic stenosis and history of breast cancer status post bilateral mastectomy presents with complaining of worsening dizziness over the last 2 days with associated nausea. Patient usually walks with help of a walker and was unable to do last 2 days. Her symptoms worsened and was brought to the ER. In the ER CT head shows left temporal mass concerning for possible meningioma. Dr. Danielle Dess on-call neurosurgeon was consulted by ER physician Dr. Dierdre Highman. At this time neurosurgeon will be seeing patient in consult. Patient's symptoms is present more on trying to ambulate or move. Denies any shortness of breath or chest pain. Denies any fever chills or productive cough nausea vomiting or diarrhea.  Past Medical History  Diagnosis Date  . VHD (valvular heart disease)   . Mitral stenosis   . Mitral regurgitation   . Tricuspid regurgitation   . Pulmonary hypertension   . LVH (left ventricular hypertrophy)   . Fatigue   . History of dizziness   . Leg cramps   . Breast cancer     Past Surgical History  Procedure Date  . Transthoracic echocardiogram 01/12/10    SHOWED SEVERE AORTIC STENOSIS WITH MILD TO MODERATE AORTIC INSUFFICEIENCY.  . Mastectomy   . Appendectomy     Family History  Problem Relation Age of Onset  . Stroke Mother   . Cancer Father   . Cancer Sister   . Heart disease Brother    Social History:  reports that she has never smoked. She does not have any smokeless tobacco history on file. She reports that she does not drink alcohol or use illicit drugs.  Allergies:  Allergies  Allergen Reactions  . Codeine Other (See Comments)    Per nursing home [facility]  . Darvocet (Propoxyphene-Acetaminophen) Other (See Comments)    Per nursing home [facility]  . Levofloxacin Other (See Comments)    Unknown  reaction  . Lisinopril Other (See Comments)    Unknown reaction  . Nitrofurantoin Monohyd Macro Other (See Comments)    Per nursing home [facility]  . Tequin Other (See Comments)    Unknown reaction     (Not in a hospital admission)  Results for orders placed during the hospital encounter of 06/07/12 (from the past 48 hour(s))  CBC     Status: Normal   Collection Time   06/07/12 11:52 PM      Component Value Range Comment   WBC 7.4  4.0 - 10.5 K/uL    RBC 4.99  3.87 - 5.11 MIL/uL    Hemoglobin 14.6  12.0 - 15.0 g/dL    HCT 16.1  09.6 - 04.5 %    MCV 87.0  78.0 - 100.0 fL    MCH 29.3  26.0 - 34.0 pg    MCHC 33.6  30.0 - 36.0 g/dL    RDW 40.9  81.1 - 91.4 %    Platelets 191  150 - 400 K/uL   POCT I-STAT, CHEM 8     Status: Abnormal   Collection Time   06/08/12 12:13 AM      Component Value Range Comment   Sodium 136  135 - 145 mEq/L    Potassium 5.9 (*) 3.5 - 5.1 mEq/L    Chloride 103  96 - 112 mEq/L    BUN 26 (*) 6 -  23 mg/dL    Creatinine, Ser 8.41  0.50 - 1.10 mg/dL    Glucose, Bld 98  70 - 99 mg/dL    Calcium, Ion 3.24  4.01 - 1.30 mmol/L    TCO2 28  0 - 100 mmol/L    Hemoglobin 15.3 (*) 12.0 - 15.0 g/dL    HCT 02.7  25.3 - 66.4 %   COMPREHENSIVE METABOLIC PANEL     Status: Abnormal (Preliminary result)   Collection Time   06/08/12  3:48 AM      Component Value Range Comment   Sodium 137  135 - 145 mEq/L    Potassium PENDING  3.5 - 5.1 mEq/L    Chloride 102  96 - 112 mEq/L    CO2 25  19 - 32 mEq/L    Glucose, Bld 91  70 - 99 mg/dL    BUN 16  6 - 23 mg/dL    Creatinine, Ser 4.03  0.50 - 1.10 mg/dL    Calcium 9.5  8.4 - 47.4 mg/dL    Total Protein 7.0  6.0 - 8.3 g/dL    Albumin 3.4 (*) 3.5 - 5.2 g/dL    AST 29  0 - 37 U/L    ALT 13  0 - 35 U/L    Alkaline Phosphatase 97  39 - 117 U/L    Total Bilirubin 0.7  0.3 - 1.2 mg/dL    GFR calc non Af Amer 55 (*) >90 mL/min    GFR calc Af Amer 64 (*) >90 mL/min    Ct Head Wo Contrast  06/08/2012  *RADIOLOGY REPORT*   Clinical Data: Dizziness.  CT HEAD WITHOUT CONTRAST  Technique:  Contiguous axial images were obtained from the base of the skull through the vertex without contrast.  Comparison: MRI 01/22/2012  Findings: There is atrophy and chronic small vessel disease changes.  Large extra-axial mass noted along the medial left temporal lobe.  This is unchanged since prior MRI.  This demonstrates partial rim calcification.  This measures 3.0 x 1.8 cm. There is extension into the posterior aspect of the left sphenoid sinus.  No hemorrhage.  No acute infarction.  No midline shift.  No acute calvarial abnormality.  IMPRESSION: Stable large extra-axial mass along the medial left temporal lobe. Favor meningioma although a large aneurysm cannot be excluded.  No hemorrhage or acute infarction.  Atrophy, chronic small vessel disease.   Original Report Authenticated By: Charlett Nose, M.D.     Review of Systems  Constitutional: Negative.   HENT: Negative.   Eyes: Negative.   Respiratory: Negative.   Cardiovascular: Negative.   Gastrointestinal: Negative.   Genitourinary: Negative.   Musculoskeletal: Negative.   Skin: Negative.   Neurological: Positive for dizziness.  Endo/Heme/Allergies: Negative.   Psychiatric/Behavioral: Negative.     Blood pressure 163/63, pulse 95, temperature 97.4 F (36.3 C), temperature source Oral, resp. rate 15, SpO2 96.00%. Physical Exam  Constitutional: She is oriented to person, place, and time. She appears well-developed and well-nourished. No distress.  HENT:  Head: Normocephalic and atraumatic.  Right Ear: External ear normal.  Left Ear: External ear normal.  Nose: Nose normal.  Mouth/Throat: Oropharynx is clear and moist. No oropharyngeal exudate.  Eyes: Conjunctivae normal are normal. Pupils are equal, round, and reactive to light. Right eye exhibits no discharge. Left eye exhibits no discharge. No scleral icterus.       Nystagmus.  Neck: Normal range of motion. Neck supple.    Cardiovascular: Normal rate and regular  rhythm.   Murmur heard. Respiratory: Effort normal and breath sounds normal. No respiratory distress. She has no wheezes. She has no rales.  GI: Soft. Bowel sounds are normal. She exhibits no distension. There is no tenderness. There is no rebound.  Musculoskeletal: She exhibits no edema and no tenderness.  Neurological: She is alert and oriented to person, place, and time.       Moves all extremities 5/5. No facial asymmetry.  Skin: Skin is warm and dry. She is not diaphoretic.     Assessment/Plan #1. Dizziness and nausea compatible with vertigo - on exam patient has horizontal nystagmus. Patient's symptoms are consistent with vertigo. Get MRI of the brain. Get physical therapy consult and check orthostatics in a.m. At this time I'm holding off patient's diuretics as patient looks clinically dehydrated. #2. Brain mass concerning for meningioma - Dr. Danielle Dess from neurosurgery is to see the patient. Patient's symptoms may be related to the mass but as per the CT head has been stable. Further recommendations per Dr. Danielle Dess. #3. Severe inoperable aortic stenosis - since patient clinically looks dehydrated I'm holding off Lasix for now. Patient did receive some IV fluids in the ER but for now I'm holding off further IV fluids. Check orthostatics. Patient's EKG showed that shows some nonspecific ST changes the patient has no chest pain. Ordering one set of troponin. #4. History of breast cancer status post bilateral mastectomy.  I have tried to reach patient's daughter with the phone number provided by patient's niece at [phone] but was unable to reach.  Patient will be transferred to [facility] for further management.  CODE STATUS - DO NOT RESUSCITATE.  KAKRAKANDY,ARSHAD N. 06/08/2012, 4:58 AM

## 2012-06-08 NOTE — ED Notes (Signed)
ambulated with one assistant to the bathroom, pt was unsteady with cane. Urine sample unable to obtain.

## 2012-06-08 NOTE — Consult Note (Signed)
Reason for Consult: Lesion on the brain per CT. Referring Physician: Dr. Ansel Bong is an 78 y.o. female.  HPI: Patient is a 78 year old right-handed white female who has a known lesion in her brain this was worked up by a neurologist Dr. Terrace Arabia a year ago where she was found to have a 3 cm mass in the mesial temporal frontal lobe which is consistent with a giant aneurysm. A repeat MRI was performed today and hospitalist demonstrates that this lesion is essentially the same as her last MRI in June of this year. Clinically the patient has had episodes of vertigo she's had some forgetfulness and some general decline in function. [facility] but the family is considering making a change. She also has a past medical history of significant aortic stenosis.  Past Medical History  Diagnosis Date  . VHD (valvular heart disease)   . Mitral stenosis   . Mitral regurgitation   . Tricuspid regurgitation   . Pulmonary hypertension   . LVH (left ventricular hypertrophy)   . Fatigue   . History of dizziness   . Leg cramps   . Breast cancer     Past Surgical History  Procedure Date  . Transthoracic echocardiogram 01/12/10    SHOWED SEVERE AORTIC STENOSIS WITH MILD TO MODERATE AORTIC INSUFFICEIENCY.  . Mastectomy   . Appendectomy     Family History  Problem Relation Age of Onset  . Stroke Mother   . Cancer Father   . Cancer Sister   . Heart disease Brother     Social History:  reports that she has never smoked. She does not have any smokeless tobacco history on file. She reports that she does not drink alcohol or use illicit drugs.  Allergies:  Allergies  Allergen Reactions  . Codeine Other (See Comments)    Per nursing home [facility]  . Darvocet (Propoxyphene-Acetaminophen) Other (See Comments)    Per nursing home [facility]  . Levofloxacin Other (See Comments)    Unknown reaction  . Lisinopril Other (See Comments)    Unknown reaction  . Nitrofurantoin  Monohyd Macro Other (See Comments)    Per nursing home mar  . Tequin Other (See Comments)    Unknown reaction    Medications: Not reviewed  Results for orders placed during the hospital encounter of 06/07/12 (from the past 48 hour(s))  CBC     Status: Normal   Collection Time   06/07/12 11:52 PM      Component Value Range Comment   WBC 7.4  4.0 - 10.5 K/uL    RBC 4.99  3.87 - 5.11 MIL/uL    Hemoglobin 14.6  12.0 - 15.0 g/dL    HCT 16.1  09.6 - 04.5 %    MCV 87.0  78.0 - 100.0 fL    MCH 29.3  26.0 - 34.0 pg    MCHC 33.6  30.0 - 36.0 g/dL    RDW 40.9  81.1 - 91.4 %    Platelets 191  150 - 400 K/uL   POCT I-STAT, CHEM 8     Status: Abnormal   Collection Time   06/08/12 12:13 AM      Component Value Range Comment   Sodium 136  135 - 145 mEq/L    Potassium 5.9 (*) 3.5 - 5.1 mEq/L    Chloride 103  96 - 112 mEq/L    BUN 26 (*) 6 - 23 mg/dL    Creatinine, Ser  1.00  0.50 - 1.10 mg/dL    Glucose, Bld 98  70 - 99 mg/dL    Calcium, Ion 0.98  1.19 - 1.30 mmol/L    TCO2 28  0 - 100 mmol/L    Hemoglobin 15.3 (*) 12.0 - 15.0 g/dL    HCT 14.7  82.9 - 56.2 %   COMPREHENSIVE METABOLIC PANEL     Status: Abnormal   Collection Time   06/08/12  3:48 AM      Component Value Range Comment   Sodium 137  135 - 145 mEq/L    Potassium 4.3  3.5 - 5.1 mEq/L    Chloride 102  96 - 112 mEq/L    CO2 25  19 - 32 mEq/L    Glucose, Bld 91  70 - 99 mg/dL    BUN 16  6 - 23 mg/dL    Creatinine, Ser 1.30  0.50 - 1.10 mg/dL    Calcium 9.5  8.4 - 86.5 mg/dL    Total Protein 7.0  6.0 - 8.3 g/dL    Albumin 3.4 (*) 3.5 - 5.2 g/dL    AST 29  0 - 37 U/L    ALT 13  0 - 35 U/L    Alkaline Phosphatase 97  39 - 117 U/L    Total Bilirubin 0.7  0.3 - 1.2 mg/dL    GFR calc non Af Amer 55 (*) >90 mL/min    GFR calc Af Amer 64 (*) >90 mL/min   COMPREHENSIVE METABOLIC PANEL     Status: Abnormal   Collection Time   06/08/12  9:20 AM      Component Value Range Comment   Sodium 137  135 - 145 mEq/L    Potassium 4.0   3.5 - 5.1 mEq/L    Chloride 101  96 - 112 mEq/L    CO2 23  19 - 32 mEq/L    Glucose, Bld 79  70 - 99 mg/dL    BUN 16  6 - 23 mg/dL    Creatinine, Ser 7.84  0.50 - 1.10 mg/dL    Calcium 9.8  8.4 - 69.6 mg/dL    Total Protein 7.1  6.0 - 8.3 g/dL    Albumin 3.4 (*) 3.5 - 5.2 g/dL    AST 29  0 - 37 U/L    ALT 12  0 - 35 U/L    Alkaline Phosphatase 95  39 - 117 U/L    Total Bilirubin 0.7  0.3 - 1.2 mg/dL    GFR calc non Af Amer 55 (*) >90 mL/min    GFR calc Af Amer 63 (*) >90 mL/min   CBC WITH DIFFERENTIAL     Status: Abnormal   Collection Time   06/08/12  9:20 AM      Component Value Range Comment   WBC 7.3  4.0 - 10.5 K/uL    RBC 5.03  3.87 - 5.11 MIL/uL    Hemoglobin 14.8  12.0 - 15.0 g/dL    HCT 29.5  28.4 - 13.2 %    MCV 87.9  78.0 - 100.0 fL    MCH 29.4  26.0 - 34.0 pg    MCHC 33.5  30.0 - 36.0 g/dL    RDW 44.0  10.2 - 72.5 %    Platelets 162  150 - 400 K/uL    Neutrophils Relative 71  43 - 77 %    Neutro Abs 5.2  1.7 - 7.7 K/uL    Lymphocytes Relative 15  12 - 46 %    Lymphs Abs 1.1  0.7 - 4.0 K/uL    Monocytes Relative 13 (*) 3 - 12 %    Monocytes Absolute 1.0  0.1 - 1.0 K/uL    Eosinophils Relative 1  0 - 5 %    Eosinophils Absolute 0.1  0.0 - 0.7 K/uL    Basophils Relative 0  0 - 1 %    Basophils Absolute 0.0  0.0 - 0.1 K/uL   TROPONIN I     Status: Normal   Collection Time   06/08/12  9:20 AM      Component Value Range Comment   Troponin I <0.30  <0.30 ng/mL     Ct Head Wo Contrast  06/08/2012  *RADIOLOGY REPORT*  Clinical Data: Dizziness.  CT HEAD WITHOUT CONTRAST  Technique:  Contiguous axial images were obtained from the base of the skull through the vertex without contrast.  Comparison: MRI 01/22/2012  Findings: There is atrophy and chronic small vessel disease changes.  Large extra-axial mass noted along the medial left temporal lobe.  This is unchanged since prior MRI.  This demonstrates partial rim calcification.  This measures 3.0 x 1.8 cm. There is  extension into the posterior aspect of the left sphenoid sinus.  No hemorrhage.  No acute infarction.  No midline shift.  No acute calvarial abnormality.  IMPRESSION: Stable large extra-axial mass along the medial left temporal lobe. Favor meningioma although a large aneurysm cannot be excluded.  No hemorrhage or acute infarction.  Atrophy, chronic small vessel disease.   Original Report Authenticated By: Charlett Nose, M.D.    Mr Brain Wo Contrast  06/08/2012  *RADIOLOGY REPORT*  Clinical Data: Persistent vertigo.  Rule out infarct.  MRI HEAD WITHOUT CONTRAST  Technique:  Multiplanar, multiecho pulse sequences of the brain and surrounding structures were obtained according to standard protocol without intravenous contrast.  Comparison: 06/08/2012 CT.  01/22/2012 MR.  Findings: 3 x 3 x 2.9 cm lesion centered in the left cavernous sinus region (with remodeling of adjacent bony structures) has an appearance most suggestive of a giant aneurysm (which may be partially thrombosed) and is only minimally change compared to the 01/22/2012 examination when this measured 3 x 2.9 x 2.8 cm.  This compresses the anterior medial aspect of the left temporal lobe of. No associated vasogenic edema.  This appears longstanding and would be better assessed by CT angiogram or MR angiogram.  No acute infarct.  Left cerebellar blood breakdown products may represent result of prior hemorrhagic ischemia, small cavernoma or result of prior trauma.  There is otherwise no evidence of intracranial hemorrhage.  Prominent small vessel disease type changes.  Remote small bilateral cerebellar infarcts.  Remote small basal ganglia and tiny thalamic infarcts.  Global atrophy.  Ventricular prominence may be related to atrophy although difficult to completely exclude mild component hydrocephalus.  Right sphenoid sinus opacification with small air-fluid level.  Major intracranial vascular structures are patent.  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  No acute infarct.  Remote infarcts and small vessel disease type changes as noted above.  Atrophy with ventricular prominence.  This has been made a PRA call report utilizing dashboard call feature.   Original Report Authenticated By: Lacy Duverney, M.D.     Review of Systems  Constitutional:       Forgetfulness  HENT:       Vertigo  Eyes: Positive for double vision.  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   Cardiovascular: Negative.   Gastrointestinal: Negative.   Genitourinary: Negative.   Musculoskeletal: Negative.   Skin: Negative.   Neurological: Negative.   Endo/Heme/Allergies: Negative.   Psychiatric/Behavioral: Negative.    Blood pressure 133/79, pulse 88, temperature 98.2 F (36.8 C), temperature source Oral, resp. rate 20, height 5\' 2"  (1.575 m), weight 54.4 kg (119 lb 14.9 oz), SpO2 93.00%. Physical Exam  Constitutional: She is oriented to person, place, and time. She appears well-developed and well-nourished.  HENT:  Head: Normocephalic and atraumatic.  Eyes:       Left partial third and sixth nerve paresis  Neck: Normal range of motion. Neck supple.  Cardiovascular: Normal rate and regular rhythm.   Respiratory: Effort normal.  Musculoskeletal: Normal range of motion.  Neurological: She is alert and oriented to person, place, and time. A cranial nerve deficit is present.       Conversant but speech is somewhat scattered and has difficulty staying on topic left partial third and sixth nerve paresis noted  Skin: Skin is warm and dry.  Psychiatric: She has a normal mood and affect. Her behavior is normal. Judgment and thought content normal.    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 I would not suggest any specific treatment given this patient's age  and the complexity of this aneurysm surely some morbidity from treatment of this aneurysm would result and I believe he'll be better to accept the natural course of the disease rather than to intervene. No specific recommendations for treatment are made. Patient may be discharged when placement recommendations are made. The vertiginous episodes may be treated primarily.  Camylle Whicker J 06/08/2012, 7:20 PM

## 2012-06-08 NOTE — ED Provider Notes (Signed)
History     CSN: [id number]  Arrival date & time 06/07/12  2043   First MD Initiated Contact with Patient 06/07/12 2300      Chief Complaint  Patient presents with  . Dizziness    (Consider location/radiation/quality/duration/timing/severity/associated sxs/prior treatment) HPI HX per PT, dizzy, unable to walk with double vision, onset tonight. Had vertigo last year with hospital admission and has seen [facility]U on the past for the same, PT states antivert usually helps, she lives in independent living facility, states has not eaten last 2 days because she is not feeling well with nausea, emesis x 1 today with symptoms,. Mod in severity, no HA, no CP or SOB. No unilateral weakness or numbness. No known agrevating or alleviating factors.   Past Medical History  Diagnosis Date  . VHD (valvular heart disease)   . Mitral stenosis   . Mitral regurgitation   . Tricuspid regurgitation   . Pulmonary hypertension   . LVH (left ventricular hypertrophy)   . Fatigue   . History of dizziness   . Leg cramps   . Breast cancer     Past Surgical History  Procedure Date  . Transthoracic echocardiogram 01/12/10    SHOWED SEVERE AORTIC STENOSIS WITH MILD TO MODERATE AORTIC INSUFFICEIENCY.  . Mastectomy     Family History  Problem Relation Age of Onset  . Stroke Mother   . Cancer Father   . Cancer Sister   . Heart disease Brother     History  Substance Use Topics  . Smoking status: Never Smoker   . Smokeless tobacco: Not on file  . Alcohol Use: No    OB History    Grav Para Term Preterm Abortions TAB SAB Ect Mult Living                  Review of Systems  Constitutional: Negative for fever and chills.  HENT: Negative for neck pain and neck stiffness.   Eyes: Positive for visual disturbance. Negative for pain.  Respiratory: Negative for shortness of breath.   Cardiovascular: Negative for chest pain.  Gastrointestinal: Positive for nausea. Negative for abdominal pain.    Genitourinary: Negative for dysuria.  Musculoskeletal: Negative for back pain.  Skin: Negative for rash.  Neurological: Positive for dizziness. Negative for headaches.  All other systems reviewed and are negative.    Allergies  Codeine; Darvocet; Levofloxacin; Lisinopril; Nitrofurantoin monohyd macro; and Tequin  Home Medications   Current Outpatient Rx  Name  Route  Sig  Dispense  Refill  . ASPIRIN EC 325 MG PO TBEC   Oral   Take 325 mg by mouth daily.         Marland Kitchen CALCIUM-D 600-400 MG-UNIT PO TABS   Oral   Take 1 tablet by mouth daily.           Marland Kitchen VITAMIN D 1000 UNITS PO TABS   Oral   Take 1,000 Units by mouth daily.         . FUROSEMIDE 20 MG PO TABS   Oral   Take 20 mg by mouth daily.         Marland Kitchen MECLIZINE HCL 25 MG PO TABS   Oral   Take 25 mg by mouth 3 (three) times daily as needed. For vertigo         . METOPROLOL TARTRATE 25 MG PO TABS   Oral   Take 12.5 mg by mouth 2 (two) times daily.          Marland Kitchen  ADULT MULTIVITAMIN W/MINERALS CH   Oral   Take 1 tablet by mouth daily. Centrum          . ICAPS PO CAPS   Oral   Take 1 capsule by mouth daily.           Marland Kitchen POLYETHYLENE GLYCOL 3350 PO PACK   Oral   Take 17 g by mouth daily as needed. For constipation.          Marland Kitchen POTASSIUM CHLORIDE ER 10 MEQ PO TBCR   Oral   Take 10 mEq by mouth daily.           Marland Kitchen NITROGLYCERIN 0.4 MG SL SUBL   Sublingual   Place 1 tablet (0.4 mg total) under the tongue every 5 (five) minutes as needed for chest pain.   100 tablet   3     BP 163/63  Pulse 95  Temp 97.4 F (36.3 C) (Oral)  Resp 15  SpO2 96%  Physical Exam  Constitutional: She is oriented to person, place, and time. She appears well-developed and well-nourished.  HENT:  Head: Normocephalic and atraumatic.  Eyes: Conjunctivae normal and EOM are normal. Pupils are equal, round, and reactive to light.  Neck: Trachea normal. Neck supple. No thyromegaly present.  Cardiovascular: Normal rate,  regular rhythm, S1 normal, S2 normal and normal pulses.     No systolic murmur is present   No diastolic murmur is present  Pulses:      Radial pulses are 2+ on the right side, and 2+ on the left side.  Pulmonary/Chest: Effort normal and breath sounds normal. She has no wheezes. She has no rhonchi. She has no rales. She exhibits no tenderness.  Abdominal: Soft. Normal appearance and bowel sounds are normal. There is no tenderness. There is no CVA tenderness and negative Murphy's sign.  Musculoskeletal:       BLE:s Calves nontender, no cords or erythema, negative Homans sign  Neurological: She is alert and oriented to person, place, and time. She has normal strength. No cranial nerve deficit or sensory deficit. GCS eye subscore is 4. GCS verbal subscore is 5. GCS motor subscore is 6.       Lateral nystagmus present, gait not tested due to symptoms.  Skin: Skin is warm and dry. No rash noted. She is not diaphoretic.  Psychiatric: Her speech is normal.       Cooperative and appropriate    ED Course  Procedures (including critical care time)  Results for orders placed during the hospital encounter of 06/07/12  CBC      Component Value Range   WBC 7.4  4.0 - 10.5 K/uL   RBC 4.99  3.87 - 5.11 MIL/uL   Hemoglobin 14.6  12.0 - 15.0 g/dL   HCT 16.1  09.6 - 04.5 %   MCV 87.0  78.0 - 100.0 fL   MCH 29.3  26.0 - 34.0 pg   MCHC 33.6  30.0 - 36.0 g/dL   RDW 40.9  81.1 - 91.4 %   Platelets 191  150 - 400 K/uL  POCT I-STAT, CHEM 8      Component Value Range   Sodium 136  135 - 145 mEq/L   Potassium 5.9 (*) 3.5 - 5.1 mEq/L   Chloride 103  96 - 112 mEq/L   BUN 26 (*) 6 - 23 mg/dL   Creatinine, Ser 7.82  0.50 - 1.10 mg/dL   Glucose, Bld 98  70 - 99 mg/dL   Calcium,  Ion 1.13  1.13 - 1.30 mmol/L   TCO2 28  0 - 100 mmol/L   Hemoglobin 15.3 (*) 12.0 - 15.0 g/dL   HCT 16.1  09.6 - 04.5 %   Ct Head Wo Contrast  06/08/2012  *RADIOLOGY REPORT*  Clinical Data: Dizziness.  CT HEAD WITHOUT CONTRAST   Technique:  Contiguous axial images were obtained from the base of the skull through the vertex without contrast.  Comparison: MRI 01/22/2012  Findings: There is atrophy and chronic small vessel disease changes.  Large extra-axial mass noted along the medial left temporal lobe.  This is unchanged since prior MRI.  This demonstrates partial rim calcification.  This measures 3.0 x 1.8 cm. There is extension into the posterior aspect of the left sphenoid sinus.  No hemorrhage.  No acute infarction.  No midline shift.  No acute calvarial abnormality.  IMPRESSION: Stable large extra-axial mass along the medial left temporal lobe. Favor meningioma although a large aneurysm cannot be excluded.  No hemorrhage or acute infarction.  Atrophy, chronic small vessel disease.   Original Report Authenticated By: Charlett Nose, M.D.      Date: 06/08/2012  Rate: 87  Rhythm: normal sinus rhythm  QRS Axis: normal  Intervals: QT prolonged  ST/T Wave abnormalities: nonspecific ST/T changes  Conduction Disutrbances:none  Narrative Interpretation:   Old EKG Reviewed: unchanged  PT declines ativan  IVfs and antivert. Zofran,  ECG, labs and CT brain reviewed and d/w DR Elsner 2:23 AM will plan MED admit.   D/w Dr Toniann Fail - will plan admit MED  MDM  Dizzieness, vision changes and h/o vertigo. Unable to walk. Has mass on CT seen on previous MRI as likely cause of symptoms, remains unable to ambulate despite IVfs and antivert.         Sunnie Nielsen, MD 06/08/12 [phone]

## 2012-06-09 LAB — URINALYSIS, ROUTINE W REFLEX MICROSCOPIC
Hgb urine dipstick: NEGATIVE
Nitrite: NEGATIVE
Specific Gravity, Urine: 1.022 (ref 1.005–1.030)
Urobilinogen, UA: 0.2 mg/dL (ref 0.0–1.0)
pH: 5.5 (ref 5.0–8.0)

## 2012-06-09 LAB — BASIC METABOLIC PANEL
Chloride: 101 mEq/L (ref 96–112)
Creatinine, Ser: 1.13 mg/dL — ABNORMAL HIGH (ref 0.50–1.10)
GFR calc Af Amer: 47 mL/min — ABNORMAL LOW (ref >90)
Potassium: 4.2 mEq/L (ref 3.5–5.1)
Sodium: 137 mEq/L (ref 135–145)

## 2012-06-09 LAB — CBC
HCT: 45 % (ref 36.0–46.0)
Hemoglobin: 14.5 g/dL (ref 12.0–15.0)
RBC: 4.98 MIL/uL (ref 3.87–5.11)
RDW: 14.5 % (ref 11.5–15.5)
WBC: 6.4 10*3/uL (ref 4.0–10.5)

## 2012-06-09 LAB — URINE MICROSCOPIC-ADD ON

## 2012-06-09 MED ORDER — ASPIRIN EC 81 MG PO TBEC
81.0000 mg | DELAYED_RELEASE_TABLET | Freq: Every day | ORAL | Status: DC
  Filled 2012-06-09: qty 1

## 2012-06-09 MED ORDER — ASPIRIN 81 MG PO TBEC: 81.0000 mg | DELAYED_RELEASE_TABLET | Freq: Every day | ORAL | Status: AC

## 2012-06-09 NOTE — Clinical Social Work Psychosocial (Signed)
     Clinical Social Work Department BRIEF PSYCHOSOCIAL ASSESSMENT 06/09/2012  Patient:  [name], [name]     Account Number:  [id number]     Admit date:  06/07/2012  Clinical Social Worker:  Margaree Mackintosh  Date/Time:  06/09/2012 12:00 M  Referred by:  Physician  Date Referred:  06/09/2012 Referred for  SNF Placement   Other Referral:   Interview type:  Patient Other interview type:   with dtr present.    PSYCHOSOCIAL DATA Living Status:  FACILITY Admitted from facility:  [facility] Level of care:  Independent Living Primary support name:  Britta Mccreedy [phone] Primary support relationship to patient:  CHILD, ADULT Degree of support available:   Adequate.    CURRENT CONCERNS Current Concerns  Post-Acute Placement   Other Concerns:    SOCIAL WORK ASSESSMENT / PLAN Clinical Social Worker recieved referral indicating pt may benefit from post acute rehab to assist with increasing pt's level of independence.  CSW reviewed chart and met with pt and pt's dtr at bedside.  CSW introduced self, explained role, and provided support.  CSW reviewed dc options; pt interested in [facility] for rehab as her best friend is currently there recieving treatment. CSW spoke with Geoffery Spruce at [facility] who stated she was "ready to receive pt".  CSW to conitnue to follow and assist as needed.   Assessment/plan status:  Information/Referral to [facility] Other assessment/ plan:   Information/referral to community resources:   SNF.    PATIENTS/FAMILYS RESPONSE TO PLAN OF CARE: Pt and dtr were pleasant and engaged in conversation. Pt and dtr thanked CSW for intervention.

## 2012-06-09 NOTE — Discharge Summary (Signed)
Physician Discharge Summary  Patient ID: [name] MRN: [id number] DOB/AGE: March 31, 1916 78 y.o.  Admit date: 06/07/2012 Discharge date: 06/09/2012  Primary Care Physician:  Kimber Relic, MD  Discharge Diagnoses:    . Dizziness . Aortic stenosis with mitral and aortic insufficiency . Vertigo . Brain aneurysm  Consults:  Neurosurgery, Dr. Danielle Dess   Discharge Medications:   Medication List     As of 06/09/2012  2:10 PM    TAKE these medications         aspirin 81 MG EC tablet   Take 1 tablet (81 mg total) by mouth daily.      Calcium-D 600-400 MG-UNIT Tabs   Take 1 tablet by mouth daily.      cholecalciferol 1000 UNITS tablet   Commonly known as: VITAMIN D   Take 1,000 Units by mouth daily.      furosemide 20 MG tablet   Commonly known as: LASIX   Take 20 mg by mouth daily.      ICAPS Caps   Take 1 capsule by mouth daily.      meclizine 25 MG tablet   Commonly known as: ANTIVERT   Take 25 mg by mouth 3 (three) times daily as needed. For vertigo      metoprolol tartrate 25 MG tablet   Commonly known as: LOPRESSOR   Take 12.5 mg by mouth 2 (two) times daily.      multivitamin with minerals Tabs   Take 1 tablet by mouth daily. Centrum      nitroGLYCERIN 0.4 MG SL tablet   Commonly known as: NITROSTAT   Place 1 tablet (0.4 mg total) under the tongue every 5 (five) minutes as needed for chest pain.      polyethylene glycol packet   Commonly known as: MIRALAX / GLYCOLAX   Take 17 g by mouth daily as needed. For constipation.      potassium chloride 10 MEQ tablet   Commonly known as: K-DUR   Take 10 mEq by mouth daily.         Brief H and P: For complete details please refer to admission H and P, but in brief 78 year old female with history of severe inoperable aortic stenosis and history of breast cancer status post bilateral mastectomy presented with a complaint of worsening dizziness over the last 2 days with associated nausea. Patient usually walks  with help of a walker and was unable to do last 2 days prior to admission. Her symptoms worsened and was brought to the ER. In the ER CT head shows left temporal mass concerning for possible meningioma. Dr. Danielle Dess on-call neurosurgeon was consulted by ER physician Dr. Dierdre Highman.   Hospital Course:  Patient is a 78 year old pleasant female who was admitted with worsening dizziness in the last 2 days prior to admission. CT head was done which showed a stable large extra-axial mass along the medial left temporal lobe meningioma versus aneurysm. Neurosurgery was consulted in MRI of the brain was performed. 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 Per neurosurgery, Dr Danielle Dess recommended no specific treatment at this time given the patient's age and complexity of this aneurysm and she'll be better to accept the natural course of the disease rather than to intervene surgically at this time. Patient was treated with meclizine for the vertigo. PT evaluation was done and recommended skilled nursing facility which was arranged at [facility] facility.    Day of Discharge  BP 158/80  Pulse 78  Temp 97.3 F (36.3 C) (Oral)  Resp 16  Ht 5\' 2"  (1.575 m)  Wt 54.4 kg (119 lb 14.9 oz)  BMI 21.94 kg/m2  SpO2 93%  Physical Exam: General: Alert and awake oriented x3 not in any acute distress. HEENT: Left partial third and sixth nerve palsy  CVS: S1-S2 clear no murmur rubs or gallops Chest: clear to auscultation bilaterally, no wheezing rales or rhonchi Abdomen: soft nontender, nondistended, normal bowel sounds, no organomegaly Extremities: no cyanosis, clubbing or edema noted bilaterally    The results of significant diagnostics from this hospitalization (including imaging, microbiology, ancillary and laboratory) are listed below for reference.    LAB RESULTS: Basic Metabolic Panel:  Lab 06/09/12 9604 06/08/12 0920  NA 137 137  K 4.2 4.0    CL 101 101  CO2 27 23  GLUCOSE 90 79  BUN 22 16  CREATININE 1.13* 0.88  CALCIUM 9.4 9.8  MG -- --  PHOS -- --   Liver Function Tests:  Lab 06/08/12 0920 06/08/12 0348  AST 29 29  ALT 12 13  ALKPHOS 95 97  BILITOT 0.7 0.7  PROT 7.1 7.0  ALBUMIN 3.4* 3.4*   No results found for this basename: LIPASE:2,AMYLASE:2 in the last 168 hours No results found for this basename: AMMONIA:2 in the last 168 hours CBC:  Lab 06/09/12 0620 06/08/12 0920  WBC 6.4 7.3  NEUTROABS -- 5.2  HGB 14.5 14.8  HCT 45.0 44.2  MCV 90.4 --  PLT 176 162   Cardiac Enzymes:  Lab 06/08/12 0920  CKTOTAL --  CKMB --  CKMBINDEX --  TROPONINI <0.30    Significant Diagnostic Studies:  Ct Head Wo Contrast  06/08/2012  *RADIOLOGY REPORT*  Clinical Data: Dizziness.  CT HEAD WITHOUT CONTRAST  Technique:  Contiguous axial images were obtained from the base of the skull through the vertex without contrast.  Comparison: MRI 01/22/2012  Findings: There is atrophy and chronic small vessel disease changes.  Large extra-axial mass noted along the medial left temporal lobe.  This is unchanged since prior MRI.  This demonstrates partial rim calcification.  This measures 3.0 x 1.8 cm. There is extension into the posterior aspect of the left sphenoid sinus.  No hemorrhage.  No acute infarction.  No midline shift.  No acute calvarial abnormality.  IMPRESSION: Stable large extra-axial mass along the medial left temporal lobe. Favor meningioma although a large aneurysm cannot be excluded.  No hemorrhage or acute infarction.  Atrophy, chronic small vessel disease.   Original Report Authenticated By: Charlett Nose, M.D.    Mr Brain Wo Contrast  06/08/2012  *RADIOLOGY REPORT*  Clinical Data: Persistent vertigo.  Rule out infarct.  MRI HEAD WITHOUT CONTRAST  Technique:  Multiplanar, multiecho pulse sequences of the brain and surrounding structures were obtained according to standard protocol without intravenous contrast.   Comparison: 06/08/2012 CT.  01/22/2012 MR.  Findings: 3 x 3 x 2.9 cm lesion centered in the left cavernous sinus region (with remodeling of adjacent bony structures) has an appearance most suggestive of a giant aneurysm (which may be partially thrombosed) and is only minimally change compared to the 01/22/2012 examination when this measured 3 x 2.9 x 2.8 cm.  This compresses the anterior medial aspect of the left temporal lobe of. No associated vasogenic edema.  This appears longstanding and would be better assessed by CT angiogram or MR angiogram.  No acute infarct.  Left cerebellar blood breakdown products may represent result  of prior hemorrhagic ischemia, small cavernoma or result of prior trauma.  There is otherwise no evidence of intracranial hemorrhage.  Prominent small vessel disease type changes.  Remote small bilateral cerebellar infarcts.  Remote small basal ganglia and tiny thalamic infarcts.  Global atrophy.  Ventricular prominence may be related to atrophy although difficult to completely exclude mild component hydrocephalus.  Right sphenoid sinus opacification with small air-fluid level.  Major intracranial vascular structures are patent.  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  No acute infarct.  Remote infarcts and small vessel disease type changes as noted above.  Atrophy with ventricular prominence.  This has been made a PRA call report utilizing dashboard call feature.   Original Report Authenticated By: Lacy Duverney, M.D.      Disposition and Follow-up:     Discharge Orders    Future Orders Please Complete By Expires   Diet - low sodium heart healthy      Increase activity slowly      Discharge instructions      Comments:   FALL PRECAUTIONS       DISPOSITION: Skilled nursing facility  DIET: Heart healthy diet ACTIVITY: As tolerated with fall precautions   DISCHARGE FOLLOW-UP Follow-up Information    Follow  up with GREEN, Lenon Curt, MD. Schedule an appointment as soon as possible for a visit in 2 weeks. (for follow-up)    Contact information:   [address] [place] [address] [phone]          Time spent on Discharge: 40 minutes  Signed:   Honora Searson M.D. [facility] 06/09/2012, 2:10 PM Pager: [phone]

## 2012-06-09 NOTE — Clinical Social Work Placement (Signed)
     Clinical Social Work Department CLINICAL SOCIAL WORK PLACEMENT NOTE 06/09/2012  Patient:  [name], [name]  Account Number:  [id number] Admit date:  06/07/2012  Clinical Social Worker:  Margaree Mackintosh  Date/time:  06/09/2012 12:00 M  Clinical Social Work is seeking post-discharge placement for this patient at the following level of care:   SKILLED NURSING   (*CSW will update this form in Epic as items are completed)   06/09/2012  Patient/family provided with [facility] Department of Clinical Social Works list of facilities offering this level of care within the geographic area requested by the patient (or if unable, by the patients family).  06/09/2012  Patient/family informed of their freedom to choose among providers that offer the needed level of care, that participate in Medicare, Medicaid or managed care program needed by the patient, have an available bed and are willing to accept the patient.  06/09/2012  Patient/family informed of [facility] ownership interest in [facility], as well as of the fact that they are under no obligation to receive care at this facility.  PASARR submitted to EDS on 06/09/2012 PASARR number received from EDS on 06/09/2012  FL2 transmitted to all facilities in geographic area requested by pt/family on  06/09/2012 FL2 transmitted to all facilities within larger geographic area on 06/09/2012  Patient informed that his/her managed care company has contracts with or will negotiate with  certain facilities, including the following:     Patient/family informed of bed offers received:  06/09/2012 Patient chooses bed at [facility] Physician recommends and patient chooses bed at  [facility] [place]  Patient to be transferred to [facility] [place] on  06/09/2012 Patient to be transferred to facility by Dtr: Melburn Popper  The following physician request were entered in Epic:   Additional Comments:

## 2012-06-09 NOTE — Progress Notes (Signed)
Gabriele Zwilling [place],PT Acute Rehabilitation [phone] (pager)  

## 2012-06-09 NOTE — Progress Notes (Signed)
Physical Therapy Treatment Patient Details Name: [name] MRN: [id number] DOB: 07/06/1916 Today's Date: 06/09/2012 Time: 4098-1191 PT Time Calculation (min): 23 min  PT Assessment / Plan / Recommendation Comments on Treatment Session  Pt was met in bed with her clothes on and bags packed.  She states she is feeling better and planning to return home today, but glad to work with physical therapy today. Pt thinks she was given meclizine this morning and is no longer feeling dizzy.  Pt did have right beating nystagmus with head thrust, however denies feeling dizzy with bed mobility and ambulation.     Follow Up Recommendations  SNF     Does the patient have the potential to tolerate intense rehabilitation     Barriers to Discharge        Equipment Recommendations  None recommended by PT    Recommendations for Other Services    Frequency Min 3X/week   Plan Discharge plan remains appropriate;Frequency remains appropriate    Precautions / Restrictions Precautions Precautions: Fall   Pertinent Vitals/Pain no apparent distress No pain    Mobility  Bed Mobility Supine to Sit: HOB flat;7: Independent (used rails to pull self) Sitting - Scoot to Edge of Bed: 7: Independent Sit to Supine: 7: Independent Details for Bed Mobility Assistance: Pt had to correct body position in bed by returning to sit, scooting toward [facility] then lying supine again, able to adjust positioning with minimal cues.  Pt states she has some mild dizziness when seated that goes away shortly after sitting from supine.  Transfers Transfers: Sit to Stand;Stand to Sit Sit to Stand: With upper extremity assist;6: Modified independent (Device/Increase time) Stand to Sit: With upper extremity assist;6: Modified independent (Device/Increase time) Details for Transfer Assistance: able to sit/stand without assistance, used hand placement appropriately and did not lose balance, denies dizziness.   Ambulation/Gait Ambulation/Gait Assistance: 5: Supervision Ambulation Distance (Feet): 75 Feet Assistive device: Rolling walker Ambulation/Gait Assistance Details: no loss of balance, but slowed gait when asked to look right/left and up/down, pt denies dizziness with head motions. Gait Pattern: Step-through pattern;Decreased stride length Gait velocity: slowed Stairs: No Wheelchair Mobility Wheelchair Mobility: No    Exercises Other Exercises Other Exercises: gaze stability x1; right beating nystagmus noted, resolved within 10seconds; pt denies dizziness     PT Goals Acute Rehab PT Goals PT Goal: Supine/Side to Sit - Progress: Met PT Goal: Sit to Supine/Side - Progress: Met PT Goal: Sit to Stand - Progress: Met PT Goal: Ambulate - Progress: Progressing toward goal  Visit Information  Last PT Received On: 06/09/12 Assistance Needed: +1    Subjective Data  Subjective: "Hi, I'm going home today and I feel fine."  Patient Stated Goal: to go home   Cognition  Overall Cognitive Status: Impaired Area of Impairment: Attention;Memory;Safety/judgement;Awareness of deficits Arousal/Alertness: Awake/alert Orientation Level: Appears intact for tasks assessed Behavior During Session: [facility] for tasks performed Safety/Judgement: Decreased safety judgement for tasks assessed Safety/Judgement - Other Comments: Pt aware that she needs assistive device and states she feels more stable with walker compared to cane, however lacks safety judgement for OOB tasks    Balance  Balance Balance Assessed: No  End of Session PT - End of Session Equipment Utilized During Treatment: Gait belt Activity Tolerance: Patient limited by fatigue Patient left: in bed;with call bell/phone within reach;with bed alarm set Nurse Communication: Mobility status       Sharion Balloon 06/09/2012, 12:26 PM Sharion Balloon, SPT Acute Rehab Services [phone]

## 2012-08-12 ENCOUNTER — Emergency Department (HOSPITAL_COMMUNITY): Payer: Medicare Other

## 2012-08-12 ENCOUNTER — Inpatient Hospital Stay (HOSPITAL_COMMUNITY)
Admission: EM | Admit: 2012-08-12 | Discharge: 2012-08-14 | DRG: 293 | Disposition: A | Discharge status: Skilled Nursing Facility | Payer: Medicare Other | Attending: Internal Medicine | Admitting: Internal Medicine

## 2012-08-12 ENCOUNTER — Encounter (HOSPITAL_COMMUNITY): Payer: Self-pay | Admitting: Emergency Medicine

## 2012-08-12 DIAGNOSIS — I671 Cerebral aneurysm, nonruptured: Secondary | ICD-10-CM | POA: Diagnosis present

## 2012-08-12 DIAGNOSIS — I509 Heart failure, unspecified: Secondary | ICD-10-CM | POA: Diagnosis present

## 2012-08-12 DIAGNOSIS — I079 Rheumatic tricuspid valve disease, unspecified: Secondary | ICD-10-CM | POA: Diagnosis present

## 2012-08-12 DIAGNOSIS — I2789 Other specified pulmonary heart diseases: Secondary | ICD-10-CM | POA: Diagnosis present

## 2012-08-12 DIAGNOSIS — Z7982 Long term (current) use of aspirin: Secondary | ICD-10-CM

## 2012-08-12 DIAGNOSIS — N189 Chronic kidney disease, unspecified: Secondary | ICD-10-CM | POA: Diagnosis not present

## 2012-08-12 DIAGNOSIS — N289 Disorder of kidney and ureter, unspecified: Secondary | ICD-10-CM | POA: Diagnosis present

## 2012-08-12 DIAGNOSIS — Z66 Do not resuscitate: Secondary | ICD-10-CM | POA: Diagnosis present

## 2012-08-12 DIAGNOSIS — I5021 Acute systolic (congestive) heart failure: Secondary | ICD-10-CM | POA: Diagnosis present

## 2012-08-12 DIAGNOSIS — I359 Nonrheumatic aortic valve disorder, unspecified: Secondary | ICD-10-CM | POA: Diagnosis present

## 2012-08-12 DIAGNOSIS — I5033 Acute on chronic diastolic (congestive) heart failure: Secondary | ICD-10-CM

## 2012-08-12 DIAGNOSIS — I059 Rheumatic mitral valve disease, unspecified: Secondary | ICD-10-CM | POA: Diagnosis present

## 2012-08-12 DIAGNOSIS — R0600 Dyspnea, unspecified: Secondary | ICD-10-CM | POA: Diagnosis present

## 2012-08-12 DIAGNOSIS — Z853 Personal history of malignant neoplasm of breast: Secondary | ICD-10-CM

## 2012-08-12 DIAGNOSIS — R42 Dizziness and giddiness: Secondary | ICD-10-CM

## 2012-08-12 DIAGNOSIS — I5043 Acute on chronic combined systolic (congestive) and diastolic (congestive) heart failure: Principal | ICD-10-CM | POA: Diagnosis present

## 2012-08-12 DIAGNOSIS — I350 Nonrheumatic aortic (valve) stenosis: Secondary | ICD-10-CM

## 2012-08-12 DIAGNOSIS — Z79899 Other long term (current) drug therapy: Secondary | ICD-10-CM

## 2012-08-12 DIAGNOSIS — H3530 Unspecified macular degeneration: Secondary | ICD-10-CM | POA: Diagnosis present

## 2012-08-12 DIAGNOSIS — I080 Rheumatic disorders of both mitral and aortic valves: Secondary | ICD-10-CM | POA: Diagnosis present

## 2012-08-12 HISTORY — DX: Nonrheumatic aortic (valve) stenosis: I35.0

## 2012-08-12 LAB — URINE MICROSCOPIC-ADD ON

## 2012-08-12 LAB — URINALYSIS, ROUTINE W REFLEX MICROSCOPIC
Bilirubin Urine: NEGATIVE
Hgb urine dipstick: NEGATIVE
Nitrite: NEGATIVE
Specific Gravity, Urine: 1.015 (ref 1.005–1.030)
pH: 5 (ref 5.0–8.0)

## 2012-08-12 LAB — CBC WITH DIFFERENTIAL/PLATELET
Eosinophils Absolute: 0.1 10*3/uL (ref 0.0–0.7)
MCH: 29.1 pg (ref 26.0–34.0)
MCHC: 32.3 g/dL (ref 30.0–36.0)
Monocytes Absolute: 0.9 10*3/uL (ref 0.1–1.0)
Neutrophils Relative %: 69 % (ref 43–77)
Platelets: 138 10*3/uL — ABNORMAL LOW (ref 150–400)
RBC: 5.6 MIL/uL — ABNORMAL HIGH (ref 3.87–5.11)

## 2012-08-12 LAB — COMPREHENSIVE METABOLIC PANEL
ALT: 16 U/L (ref 0–35)
AST: 34 U/L (ref 0–37)
Albumin: 3.2 g/dL — ABNORMAL LOW (ref 3.5–5.2)
Alkaline Phosphatase: 131 U/L — ABNORMAL HIGH (ref 39–117)
Potassium: 4.9 mEq/L (ref 3.5–5.1)
Sodium: 136 mEq/L (ref 135–145)
Total Protein: 7.4 g/dL (ref 6.0–8.3)

## 2012-08-12 MED ORDER — MECLIZINE HCL 25 MG PO TABS
25.0000 mg | ORAL_TABLET | Freq: Three times a day (TID) | ORAL | Status: DC | PRN
  Filled 2012-08-12: qty 1

## 2012-08-12 MED ORDER — FUROSEMIDE 10 MG/ML IJ SOLN
20.0000 mg | Freq: Two times a day (BID) | INTRAMUSCULAR | Status: DC
  Administered 2012-08-12 – 2012-08-13 (×3): 20 mg via INTRAVENOUS
  Filled 2012-08-12 (×5): qty 2

## 2012-08-12 MED ORDER — METOPROLOL TARTRATE 25 MG PO TABS
25.0000 mg | ORAL_TABLET | Freq: Two times a day (BID) | ORAL | Status: DC
  Administered 2012-08-13 – 2012-08-14 (×3): 25 mg via ORAL
  Filled 2012-08-12 (×4): qty 1

## 2012-08-12 MED ORDER — ASPIRIN EC 81 MG PO TBEC
81.0000 mg | DELAYED_RELEASE_TABLET | Freq: Every day | ORAL | Status: DC
  Administered 2012-08-13 – 2012-08-14 (×2): 81 mg via ORAL
  Filled 2012-08-12 (×2): qty 1

## 2012-08-12 MED ORDER — FUROSEMIDE 10 MG/ML IJ SOLN
20.0000 mg | Freq: Once | INTRAMUSCULAR | Status: AC
  Administered 2012-08-12: 20 mg via INTRAVENOUS
  Filled 2012-08-12: qty 4

## 2012-08-12 MED ORDER — METOPROLOL TARTRATE 1 MG/ML IV SOLN
2.5000 mg | Freq: Once | INTRAVENOUS | Status: DC
  Filled 2012-08-12: qty 5

## 2012-08-12 MED ORDER — SODIUM CHLORIDE 0.9 % IJ SOLN: 3.0000 mL | INTRAMUSCULAR | Status: DC | PRN

## 2012-08-12 MED ORDER — SODIUM CHLORIDE 0.9 % IV SOLN: 250.0000 mL | INTRAVENOUS | Status: DC | PRN

## 2012-08-12 MED ORDER — MIRTAZAPINE 7.5 MG PO TABS
7.5000 mg | ORAL_TABLET | Freq: Every day | ORAL | Status: DC
  Administered 2012-08-12 – 2012-08-13 (×2): 7.5 mg via ORAL
  Filled 2012-08-12 (×4): qty 1

## 2012-08-12 MED ORDER — SODIUM CHLORIDE 0.9 % IJ SOLN
3.0000 mL | Freq: Two times a day (BID) | INTRAMUSCULAR | Status: DC
  Administered 2012-08-12 – 2012-08-14 (×4): 3 mL via INTRAVENOUS

## 2012-08-12 MED ORDER — NITROGLYCERIN 0.4 MG SL SUBL: 0.4000 mg | SUBLINGUAL_TABLET | SUBLINGUAL | Status: DC | PRN

## 2012-08-12 MED ORDER — POTASSIUM CHLORIDE ER 10 MEQ PO TBCR
10.0000 meq | EXTENDED_RELEASE_TABLET | Freq: Every day | ORAL | Status: DC
  Administered 2012-08-13 – 2012-08-14 (×2): 10 meq via ORAL
  Filled 2012-08-12 (×2): qty 1

## 2012-08-12 NOTE — ED Provider Notes (Addendum)
History     CSN: [id number]  Arrival date & time 08/12/12  1545   First MD Initiated Contact with Patient 08/12/12 1558      Chief Complaint  Patient presents with  . Tachycardia  . Shortness of Breath    (Consider location/radiation/quality/duration/timing/severity/associated sxs/prior treatment) The history is provided by the patient.  [name] is a 79 y.o. female history of pulmonary hypertension, inoperable critical aortic stenosis, mitral stenosis, LVH here with increasing fatigue and shortness of breath and tachycardia. She feels short of breath this morning. She was noted to have a fast heart rate and her oxygen sats were lower after physical therapy. She was put on oxygen and given 12.5 mg of Lopressor. As per family she has been more tired than usual. Patient is demented and unable to answer many questions.  Level V caveat- dementia    Past Medical History  Diagnosis Date  . VHD (valvular heart disease)   . Mitral stenosis   . Mitral regurgitation   . Tricuspid regurgitation   . Pulmonary hypertension   . LVH (left ventricular hypertrophy)   . Fatigue   . History of dizziness   . Leg cramps   . Breast cancer     Past Surgical History  Procedure Date  . Transthoracic echocardiogram 01/12/10    SHOWED SEVERE AORTIC STENOSIS WITH MILD TO MODERATE AORTIC INSUFFICEIENCY.  . Mastectomy   . Appendectomy     Family History  Problem Relation Age of Onset  . Stroke Mother   . Cancer Father   . Cancer Sister   . Heart disease Brother     History  Substance Use Topics  . Smoking status: Never Smoker   . Smokeless tobacco: Not on file  . Alcohol Use: No    OB History    Grav Para Term Preterm Abortions TAB SAB Ect Mult Living                  Review of Systems  Respiratory: Positive for shortness of breath.   Cardiovascular: Positive for palpitations.  All other systems reviewed and are negative.    Allergies  Codeine; Darvocet; Levofloxacin;  Lisinopril; Nitrofurantoin monohyd macro; and Tequin  Home Medications   Current Outpatient Rx  Name  Route  Sig  Dispense  Refill  . ASPIRIN 81 MG PO TBEC   Oral   Take 1 tablet (81 mg total) by mouth daily.   30 tablet   3   . CALCIUM-D 600-400 MG-UNIT PO TABS   Oral   Take 1 tablet by mouth daily.           Marland Kitchen VITAMIN D 1000 UNITS PO TABS   Oral   Take 1,000 Units by mouth daily.         . FUROSEMIDE 20 MG PO TABS   Oral   Take 20 mg by mouth daily.         Marland Kitchen BOOST PO LIQD   Oral   Take 237 mLs by mouth 2 (two) times daily with a meal.         . MECLIZINE HCL 25 MG PO TABS   Oral   Take 25 mg by mouth 3 (three) times daily as needed. For vertigo         . METOPROLOL TARTRATE 25 MG PO TABS   Oral   Take 25 mg by mouth 2 (two) times daily.          Marland Kitchen MIRTAZAPINE 15 MG  PO TABS   Oral   Take 7.5 mg by mouth at bedtime. Take 1/2 tablet at bedtime         . ADULT MULTIVITAMIN W/MINERALS CH   Oral   Take 1 tablet by mouth daily. Centrum          . ICAPS PO CAPS   Oral   Take 1 capsule by mouth daily.           Marland Kitchen NITROGLYCERIN 0.4 MG SL SUBL   Sublingual   Place 1 tablet (0.4 mg total) under the tongue every 5 (five) minutes as needed for chest pain.   100 tablet   3   . POLYETHYLENE GLYCOL 3350 PO PACK   Oral   Take 17 g by mouth daily as needed. For constipation.          Marland Kitchen POTASSIUM CHLORIDE ER 10 MEQ PO TBCR   Oral   Take 10 mEq by mouth daily.             BP 136/95  Pulse 131  Temp 98 F (36.7 C) (Rectal)  Resp 35  SpO2 97%  Physical Exam  Nursing note and vitals reviewed. Constitutional: She is oriented to person, place, and time.       Slightly tachypneic. NAD   HENT:  Head: Normocephalic.  Mouth/Throat: Oropharynx is clear and moist.  Eyes: Conjunctivae normal are normal. Pupils are equal, round, and reactive to light.  Neck: Normal range of motion. Neck supple.  Cardiovascular: Regular rhythm.        Tachycardic  , + systolic ejection murmur   Pulmonary/Chest: Effort normal.       Dec breath sounds bilateral bases. Minimal crackles   Abdominal: Soft. Bowel sounds are normal. She exhibits no distension. There is no tenderness. There is no rebound.  Musculoskeletal: Normal range of motion.  Neurological: She is alert and oriented to person, place, and time.  Skin: Skin is warm and dry.  Psychiatric: She has a normal mood and affect. Her behavior is normal. Judgment and thought content normal.    ED Course  Procedures (including critical care time)  Labs Reviewed  CBC WITH DIFFERENTIAL - Abnormal; Notable for the following:    RBC 5.60 (*)     Hemoglobin 16.3 (*)     HCT 50.5 (*)     RDW 17.0 (*)     Platelets 138 (*)     Monocytes Relative 13 (*)     All other components within normal limits  COMPREHENSIVE METABOLIC PANEL - Abnormal; Notable for the following:    BUN 28 (*)     Albumin 3.2 (*)     Alkaline Phosphatase 131 (*)     GFR calc non Af Amer 45 (*)     GFR calc Af Amer 52 (*)     All other components within normal limits  URINALYSIS, ROUTINE W REFLEX MICROSCOPIC - Abnormal; Notable for the following:    Leukocytes, UA TRACE (*)     All other components within normal limits  PRO B NATRIURETIC PEPTIDE - Abnormal; Notable for the following:    Pro B Natriuretic peptide (BNP) 6337.0 (*)     All other components within normal limits  URINE MICROSCOPIC-ADD ON - Abnormal; Notable for the following:    Casts HYALINE CASTS (*)     All other components within normal limits  TROPONIN I   Dg Chest 2 View  08/12/2012  *RADIOLOGY REPORT*  Clinical Data: Tachycardia and  shortness of breath.  CHEST - 2 VIEW  Comparison: Two-view chest 07/01/2011.  Findings: The heart is enlarged.  Mild interstitial edema is new. Mild bibasilar atelectasis is evident.  Small bilateral pleural effusions are evident.  Exaggerated thoracic kyphosis is stable. Postsurgical changes are again noted in the right  axilla.  IMPRESSION:  1.  Progressive congestive heart failure. 2.  Increasing bibasilar airspace disease likely reflects atelectasis. 3.  Postsurgical changes of the right axilla.   Original Report Authenticated By: Marin Roberts, M.D.      No diagnosis found.   Date: 08/12/2012  Rate: 132  Rhythm: sinus tachycardia  QRS Axis: normal  Intervals: PR prolonged  ST/T Wave abnormalities: ST depression precordial leads   Conduction Disutrbances:none  Narrative Interpretation: Rates fatehr, QT prolonged   Old EKG Reviewed: changes noted    MDM  [name] is a 79 y.o. female hx of pulmonary hypertension here with SOB, tachycardia. I think she may have worsening pulmonary hypertension causing heart failure. Also will consider infections given that she is more tired than usual. Will get CBC, CMP, UA, CXR, BNP and reassess. Will give lopressor IV to slow heart rate down as the EKG changes might be rate related.    5:40PM Trop neg x 1. BNP 6000 no baseline. CXR showed worsening failure. I think her symptoms are from worsening heart failure. I ordered lasix 20mg  and foley placed for monitoring I&O. I called her cardiologist, Dr. Charlestine Massed, who agreed with me. He also mentioned that we should avoid beta blockers as she has severe aortic stenosis. He recommend medical admission for diuresis.   Sepsis workup showed no UTI or pneumonia. She doesn't appear septic. WBC nl.   6:22 PM I called hospitalist, Dr. Darletta Moll, who accepted the patient on tele.     Richardean Canal, MD 08/12/12 Rickey Primus  Richardean Canal, MD 08/12/12 [phone]

## 2012-08-12 NOTE — ED Notes (Signed)
Pt returned from xray

## 2012-08-12 NOTE — ED Notes (Addendum)
PER EMS- pt picked [facility] with c/o SOB and increased heart rate for more than 3-4hours.  Staff reports after physical therapy today they noticed the heart rate was increased and o2 sats were dropping.  Pt was placed on 2L oxygen and given 12.5mg  of lopressor at 1230.  Pt denies any complaints.  Family noticed that pt has been more fatigue than usual.  Pt has hx of dementia.  Pt arrives to ED alert and oriented per baseline. Pt is DNR.

## 2012-08-12 NOTE — ED Notes (Signed)
[id number]<AV> Expected date:<BR> Expected time:<BR> Means of arrival:<BR> Comments:<BR> Tachy/SOB after physical therapy

## 2012-08-12 NOTE — Progress Notes (Signed)
[facility] ED CM noted cm consult for Heart failure home health screen. Deferred to unit CM

## 2012-08-12 NOTE — H&P (Signed)
History and Physical  [name] OZH:[id number] DOB: 1916/07/21 DOA: 08/12/2012  Referring physician: Chaney Malling, MD PCP: Kimber Relic, MD  Cardiologist: Cassell Clement, MD  Chief Complaint: Shortness of breath  HPI:  79 year old woman past medical history severe aortic stenosis, pulmonary hypertension presented with increased shortness of breath today, tachycardia; initial workup suggested acute heart failure.  Patient resides at [facility] where she receives physical therapy. This morning she felt a little short of breath, however her daughter noted that her shortness of breath did not affect her ability to speak in full sentences. During physical therapy she was noted to be tachycardic. Even with rest or tachycardia did not improve and she was given a dose of Lopressor. Despite this tachycardia persisted and she was referred to the emergency department.  In general patient has felt well of late. She denies chest pain, left arm, neck or jaw pain, diaphoresis. She felt mildly short of breath earlier today. She took her medications today.  In the emergency department was noted to be tachycardic, tachypneic, afebrile with normal blood pressure. Initial workup was notable for BNP 6337, chest x-ray revealing progressive congestive heart failure and EKG revealing narrow complex tachycardia as below. EDP discussed with cardiologist Dr. Patty Sermons who suggested Lasix. Avoid beta blockers secondary to severe aortic stenosis per EDP discussion with cardiology. She was given a dose of Lasix with approximately 150 mL of urine returned.  Review of Systems:  Negative for fever, visual changes, sore throat, rash, new muscle aches, chest pain, dysuria, bleeding, n/v/abdominal pain.  Past Medical History  Diagnosis Date  . VHD (valvular heart disease)   . Mitral stenosis   . Mitral regurgitation   . Tricuspid regurgitation   . Pulmonary hypertension   . LVH (left ventricular hypertrophy)   .  Fatigue   . History of dizziness   . Leg cramps   . Breast cancer     Past Surgical History  Procedure Date  . Transthoracic echocardiogram 01/12/10    SHOWED SEVERE AORTIC STENOSIS WITH MILD TO MODERATE AORTIC INSUFFICEIENCY.  . Mastectomy   . Appendectomy     Social History:  reports that she has never smoked. She does not have any smokeless tobacco history on file. She reports that she does not drink alcohol or use illicit drugs.  Allergies  Allergen Reactions  . Codeine Other (See Comments)    Per nursing home [facility]  . Darvocet (Propoxyphene-Acetaminophen) Other (See Comments)    Per nursing home [facility]  . Levofloxacin Other (See Comments)    Unknown reaction  . Lisinopril Other (See Comments)    Unknown reaction  . Nitrofurantoin Monohyd Macro Other (See Comments)    Per nursing home [facility]  . Tequin Other (See Comments)    Unknown reaction    Family History  Problem Relation Age of Onset  . Stroke Mother   . Cancer Father   . Cancer Sister   . Heart disease Brother      Prior to Admission medications   Medication Sig Start Date End Date Taking? Authorizing Provider  aspirin EC 81 MG EC tablet Take 1 tablet (81 mg total) by mouth daily. 06/09/12  Yes Ripudeep Jenna Luo, MD  Calcium Carbonate-Vitamin D (CALCIUM-D) 600-400 MG-UNIT TABS Take 1 tablet by mouth daily.     Yes Historical Provider, MD  cholecalciferol (VITAMIN D) 1000 UNITS tablet Take 1,000 Units by mouth daily.   Yes Historical Provider, MD  furosemide (LASIX) 20 MG tablet Take 20  mg by mouth daily.   Yes Historical Provider, MD  lactose free nutrition (BOOST) LIQD Take 237 mLs by mouth 2 (two) times daily with a meal.   Yes Historical Provider, MD  meclizine (ANTIVERT) 25 MG tablet Take 25 mg by mouth 3 (three) times daily as needed. For vertigo   Yes Historical Provider, MD  metoprolol tartrate (LOPRESSOR) 25 MG tablet Take 25 mg by mouth 2 (two) times daily.    Yes Historical Provider, MD  mirtazapine  (REMERON) 15 MG tablet Take 7.5 mg by mouth at bedtime. Take 1/2 tablet at bedtime   Yes Historical Provider, MD  Multiple Vitamin (MULITIVITAMIN WITH MINERALS) TABS Take 1 tablet by mouth daily. Centrum    Yes Historical Provider, MD  Multiple Vitamins-Minerals (ICAPS) CAPS Take 1 capsule by mouth daily.     Yes Historical Provider, MD  nitroGLYCERIN (NITROQUICK) 0.4 MG SL tablet Place 1 tablet (0.4 mg total) under the tongue every 5 (five) minutes as needed for chest pain. 11/13/10  Yes Cassell Clement, MD  polyethylene glycol (MIRALAX / GLYCOLAX) packet Take 17 g by mouth daily as needed. For constipation.    Yes Historical Provider, MD  potassium chloride (K-DUR) 10 MEQ tablet Take 10 mEq by mouth daily.     Yes Historical Provider, MD   Physical Exam: Filed Vitals:   08/12/12 1558 08/12/12 1600  BP: 136/95   Pulse: 131   Temp: 98 F (36.7 C)   TempSrc: Rectal   Resp: 35   SpO2: 98% 97%    General:  Examined in the emergency department. Appears calm and comfortable without apparent shortness of breath. Eyes: Right pupil eccentric, not reactive; left pupil round, not reactive, normal lids, irises  ENT: grossly normal hearing, lips  Neck: no LAD, masses or thyromegaly Cardiovascular: Tachycardic, regular rhythm, no m/r/g. No LE edema. Telemetry: Narrow complex tachycardia, rate 130s predominantly Respiratory: Decreased breath sounds, no w/r/r. Normal respiratory effort. Abdomen: soft, ntnd Skin: no rash or induration seen on limited exam Musculoskeletal: grossly normal tone BUE/BLE Psychiatric: grossly normal mood and affect, speech fluent and appropriate Neurologic: grossly non-focal.  Wt Readings from Last 3 Encounters:  06/08/12 54.4 kg (119 lb 14.9 oz)  05/21/12 55.248 kg (121 lb 12.8 oz)  12/27/11 55.339 kg (122 lb)    Labs on Admission:  Basic Metabolic Panel:  Lab 08/12/12 1610  NA 136  K 4.9  CL 102  CO2 25  GLUCOSE 92  BUN 28*  CREATININE 1.03  CALCIUM 9.9    MG --  PHOS --    Liver Function Tests:  Lab 08/12/12 1608  AST 34  ALT 16  ALKPHOS 131*  BILITOT 0.5  PROT 7.4  ALBUMIN 3.2*    CBC:  Lab 08/12/12 1608  WBC 6.8  NEUTROABS 4.7  HGB 16.3*  HCT 50.5*  MCV 90.2  PLT 138*    Cardiac Enzymes:  Lab 08/12/12 1608  CKTOTAL --  CKMB --  CKMBINDEX --  TROPONINI <0.30     Basename 08/12/12 1609  PROBNP 6337.0*   Radiological Exams on Admission: Dg Chest 2 View  08/12/2012  *RADIOLOGY REPORT*  Clinical Data: Tachycardia and shortness of breath.  CHEST - 2 VIEW  Comparison: Two-view chest 07/01/2011.  Findings: The heart is enlarged.  Mild interstitial edema is new. Mild bibasilar atelectasis is evident.  Small bilateral pleural effusions are evident.  Exaggerated thoracic kyphosis is stable. Postsurgical changes are again noted in the right axilla.  IMPRESSION:  1.  Progressive congestive heart failure. 2.  Increasing bibasilar airspace disease likely reflects atelectasis. 3.  Postsurgical changes of the right axilla.   Original Report Authenticated By: Marin Roberts, M.D.     EKG: Independently reviewed.Sinus tachycardia, prolonged QT, no acute changes.   Principal Problem:  *Acute heart failure Active Problems:  Aortic stenosis with mitral and aortic insufficiency  Brain aneurysm   Assessment/Plan 1. Acute heart failure: Likely secondary to severe aortic stenosis, valvular heart disease, pulmonary hypertension. Echocardiogram at this point would not change management. IV Lasix. No history to suggest ACS. 2. Valvular heart disease: Inoperable aortic stenosis, tricuspid regurgitation, mitral regurgitation. Treatment as above. 3. History of Macular degeneration 4. 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    Code Status: DO NOT RESUSCITATE Family Communication: Discussed with daughter (Babs) and niece at bedside. Disposition  Plan/Anticipated LOS: Admit  Time spent: 60 minutes  Brendia Sacks, MD  [facility] Pager [phone] 08/12/2012, 6:16 PM

## 2012-08-12 NOTE — ED Notes (Signed)
Patient transported to X-ray 

## 2012-08-13 ENCOUNTER — Encounter (HOSPITAL_COMMUNITY): Payer: Self-pay | Admitting: Cardiology

## 2012-08-13 DIAGNOSIS — I359 Nonrheumatic aortic valve disorder, unspecified: Secondary | ICD-10-CM

## 2012-08-13 DIAGNOSIS — I080 Rheumatic disorders of both mitral and aortic valves: Secondary | ICD-10-CM

## 2012-08-13 DIAGNOSIS — R0609 Other forms of dyspnea: Secondary | ICD-10-CM

## 2012-08-13 DIAGNOSIS — I5021 Acute systolic (congestive) heart failure: Secondary | ICD-10-CM

## 2012-08-13 DIAGNOSIS — I671 Cerebral aneurysm, nonruptured: Secondary | ICD-10-CM

## 2012-08-13 LAB — BASIC METABOLIC PANEL
BUN: 31 mg/dL — ABNORMAL HIGH (ref 6–23)
Calcium: 9.7 mg/dL (ref 8.4–10.5)
Creatinine, Ser: 1.14 mg/dL — ABNORMAL HIGH (ref 0.50–1.10)
GFR calc Af Amer: 46 mL/min — ABNORMAL LOW (ref >90)
GFR calc non Af Amer: 40 mL/min — ABNORMAL LOW (ref >90)
Glucose, Bld: 99 mg/dL (ref 70–99)
Potassium: 3.8 mEq/L (ref 3.5–5.1)

## 2012-08-13 MED ORDER — CHLORHEXIDINE GLUCONATE CLOTH 2 % EX PADS
6.0000 | MEDICATED_PAD | Freq: Every day | CUTANEOUS | Status: DC
  Administered 2012-08-13 – 2012-08-14 (×2): 6 via TOPICAL

## 2012-08-13 MED ORDER — MUPIROCIN 2 % EX OINT
1.0000 "application " | TOPICAL_OINTMENT | Freq: Two times a day (BID) | CUTANEOUS | Status: DC
  Administered 2012-08-13 – 2012-08-14 (×3): 1 via NASAL
  Filled 2012-08-13: qty 22

## 2012-08-13 NOTE — Consult Note (Signed)
CARDIOLOGY CONSULT NOTE  Patient ID: [name] MRN: [id number] DOB/AGE: 1915-10-26 79 y.o.  Admit date: 08/12/2012 Primary Physician Kimber Relic, MD Primary Cardiologist  Dr. Patty Sermons Chief Complaint  Dyspnea  HPI:  The patient was admitted on 1/15 with dyspnea.  She has a history of moderate AS with severe calcification, mild LV systolic dysfunction and mild to moderate MR.  She was noted in the nursing home to have tachycardia.  In the ER she did edema on CXR and an elevated BNP.  She was  treated with beta blocker and IV Lasix.  Enzymes have been negative.   She lives at a nursing home and says that the day before admission she was short of breath. She does not recall having a particularly rapid heart rate by her report. However, she said the nurses thought she should come to the ER. She says she typically ambulates with her walker.  She said that she been having some shortness of breath with this. There is nobody in the room to corroborate her story and a dimension of dementia though she seems very clear to me. She denies any chest discomfort, neck or arm discomfort. I don't know if there's been any weight gain. She doesn't report any edema. She doesn't report any nausea vomiting or diaphoresis.  Past Medical History  Diagnosis Date  . Aortic stenosis   . Mitral regurgitation   . Tricuspid regurgitation   . Pulmonary hypertension   . LVH (left ventricular hypertrophy)   . Fatigue   . History of dizziness   . Leg cramps   . Breast cancer     Past Surgical History  Procedure Date  . Mastectomy   . Appendectomy     Allergies  Allergen Reactions  . Codeine Other (See Comments)    Per nursing home [facility]  . Darvocet (Propoxyphene-Acetaminophen) Other (See Comments)    Per nursing home [facility]  . Levofloxacin Other (See Comments)    Unknown reaction  . Lisinopril Other (See Comments)    Unknown reaction  . Nitrofurantoin Monohyd Macro Other (See Comments)    Per nursing home [facility]   . Tequin Other (See Comments)    Unknown reaction   Prescriptions prior to admission  Medication Sig Dispense Refill  . aspirin EC 81 MG EC tablet Take 1 tablet (81 mg total) by mouth daily.  30 tablet  3  . Calcium Carbonate-Vitamin D (CALCIUM-D) 600-400 MG-UNIT TABS Take 1 tablet by mouth daily.        . cholecalciferol (VITAMIN D) 1000 UNITS tablet Take 1,000 Units by mouth daily.      . furosemide (LASIX) 20 MG tablet Take 20 mg by mouth daily.      Marland Kitchen lactose free nutrition (BOOST) LIQD Take 237 mLs by mouth 2 (two) times daily with a meal.      . meclizine (ANTIVERT) 25 MG tablet Take 25 mg by mouth 3 (three) times daily as needed. For vertigo      . metoprolol tartrate (LOPRESSOR) 25 MG tablet Take 25 mg by mouth 2 (two) times daily.       . mirtazapine (REMERON) 15 MG tablet Take 7.5 mg by mouth at bedtime. Take 1/2 tablet at bedtime      . Multiple Vitamin (MULITIVITAMIN WITH MINERALS) TABS Take 1 tablet by mouth daily. Centrum       . Multiple Vitamins-Minerals (ICAPS) CAPS Take 1 capsule by mouth daily.        . nitroGLYCERIN (NITROQUICK)  0.4 MG SL tablet Place 1 tablet (0.4 mg total) under the tongue every 5 (five) minutes as needed for chest pain.  100 tablet  3  . polyethylene glycol (MIRALAX / GLYCOLAX) packet Take 17 g by mouth daily as needed. For constipation.       . potassium chloride (K-DUR) 10 MEQ tablet Take 10 mEq by mouth daily.         Family History  Problem Relation Age of Onset  . Stroke Mother   . Cancer Father   . Cancer Sister   . Heart disease Brother     History   Social History  . Marital Status: Widowed    Spouse Name: N/A    Number of Children: N/A  . Years of Education: N/A   Occupational History  . Not on file.   Social History Main Topics  . Smoking status: Never Smoker   . Smokeless tobacco: Not on file  . Alcohol Use: No  . Drug Use: No  . Sexually Active:    Other Topics Concern  . Not on file   Social History Narrative  . No  narrative on file     ROS:  As stated in the HPI and negative for all other systems.  Physical Exam: Blood pressure 112/46, pulse 77, temperature 97.5 F (36.4 C), temperature source Oral, resp. rate 18, height 5\' 2"  (1.575 m), weight 116 lb 12.8 oz (52.98 kg), SpO2 90.00%.  GENERAL:  Frail and thin appearing HEENT:  Pupils equal round and reactive, fundi not visualized, oral mucosa unremarkable NECK:  No jugular venous distention, waveform within normal limits, carotid upstroke brisk and symmetric, no bruits, no thyromegaly LYMPHATICS:  No cervical, inguinal adenopathy LUNGS:  Clear to auscultation bilaterally BACK:  No CVA tenderness CHEST:  Unremarkable HEART:  PMI not displaced or sustained,S1 and S2 within normal limits, no S3, no S4, no clicks, no rubs, apical mid peaking systolic murmur, no diastolic murmurs ABD:  Flat, positive bowel sounds normal in frequency in pitch, no bruits, no rebound, no guarding, no midline pulsatile mass, no hepatomegaly, no splenomegaly EXT:  2 plus pulses throughout, no edema, no cyanosis no clubbing SKIN:  No rashes no nodules NEURO:  Cranial nerves II through XII grossly intact, motor grossly intact throughout, diffuse muscle wasting PSYCH:  Cognitively intact, oriented to person place and time  Labs: Lab Results  Component Value Date   BUN 31* 08/13/2012   Lab Results  Component Value Date   CREATININE 1.14* 08/13/2012   Lab Results  Component Value Date   NA 142 08/13/2012   K 3.8 08/13/2012   CL 99 08/13/2012   CO2 31 08/13/2012   Lab Results  Component Value Date   TROPONINI <0.30 08/12/2012   Lab Results  Component Value Date   WBC 6.8 08/12/2012   HGB 16.3* 08/12/2012   HCT 50.5* 08/12/2012   MCV 90.2 08/12/2012   PLT 138* 08/12/2012   No results found for this basename: CHOL,  HDL,  LDLCALC,  LDLDIRECT,  TRIG,  CHOLHDL   Lab Results  Component Value Date   ALT 16 08/12/2012   AST 34 08/12/2012   ALKPHOS 131* 08/12/2012   BILITOT  0.5 08/12/2012     Radiology:   CXR 1. Progressive congestive heart failure.  2. Increasing bibasilar airspace disease likely reflects  atelectasis.  3. Postsurgical changes of the right axilla.   EKG:  Sinus tachycardia with rate 132.  No acute ST T wave changes.  08/12/12  ASSESSMENT AND PLAN:   Dyspnea This does seem to be congestive heart failure. For now I agree with continued IV diuretic. Would likely switch this back to oral tomorrow. She might well need a slightly higher home dose. We can talk to her, her nursing home and her family about salt restriction if this isn't in place and daily weights. An echocardiogram is pending.  Tachycardia This appears to have been sinus tachycardia. I don't see any evidence of a rapid onset or offset. This is probably secondary to her heart failure symptoms. This again can be managed conservatively.  She can continue on the current dose of beta blocker.   SignedRollene Rotunda 08/13/2012, 3:43 PM

## 2012-08-13 NOTE — Care Management Note (Signed)
    Page 1 of 1   08/14/2012     2:44:51 PM   CARE MANAGEMENT NOTE 08/14/2012  Patient:  MARGAN, ELIAS   Account Number:  [id number]  Date Initiated:  08/13/2012  Documentation initiated by:  Lanier Clam  Subjective/Objective Assessment:   ADMITTED W/SOB.ACUTE HEART FAILURE.     Action/Plan:   FROM SNF.   Anticipated DC Date:  08/14/2012   Anticipated DC Plan:  SKILLED NURSING FACILITY      DC Planning Services  CM consult      Choice offered to / List presented to:             Status of service:  Completed, signed off Medicare Important Message given?   (If response is "NO", the following Medicare IM given date fields will be blank) Date Medicare IM given:   Date Additional Medicare IM given:    Discharge Disposition:  SKILLED NURSING FACILITY  Per UR Regulation:  Reviewed for med. necessity/level of care/duration of stay  If discussed at Long Length of Stay Meetings, dates discussed:    Comments:  08/14/12 Efren Kross RN,BSN NCM [phone] D/C SNF.  08/13/12 Raevyn Sokol RN,BSN NCM [phone] AWAIT PT/OT RECOMMENDATIONS.

## 2012-08-13 NOTE — Progress Notes (Signed)
Echocardiogram 2D Echocardiogram has been performed.  [name] 08/13/2012, 12:31 PM

## 2012-08-13 NOTE — Progress Notes (Signed)
[facility] PROGRESS NOTE  [name] WUJ:[id number] DOB: [birth year] DOA: 08/12/2012 PCP: Kimber Relic, MD  Assessment/Plan: #1 Acute systolic heart failure Questionable etiology. Troponins are negative x2. Patient on presentation [place] clinical symptoms of shortness of breath. Pro BNP was elevated at 6337. Patient currently does have JVD. Patient with clinical improvement with diuresis. Will check a TSH. Check a 2-D echo. Continue current IV dose of Lasix 20 mg IV every 12 hours. Continue metoprolol. Continue aspirin. Strict I.'s and O.'s. Daily weights. Due to patient's history of aortic stenosis and valvular disease we'll consult with cardiology for further evaluation and management.  #2 valvular heart disease Patient with a history of aortic stenosis tricuspid regurgitation and mitral valve regurgitation. Will diuresis gently with Lasix 20 mg IV every 12 hours. Troponins are negative x2. Will check a 2-D echo. We'll consult with cardiology for further evaluation and management.  #3  history of macular degeneration  #4 history of giant left internal carotid artery cavernous segment aneurysm No intervention per neurosurgery from November of 2013. Follow.  #5 prophylaxis Lovenox for DVT prophylaxis.  Code Status: DNR Family Communication: Updated patient no family at bedside Disposition Plan: Back to nursing facility when medically stable.   Consultants:  Cardiology pending  Procedures:  Chest x-ray 08/12/2012  Antibiotics:  None  HPI/Subjective: Patient is sleeping but easily arousable. Patient states shortness of breath has improved since admission. Patient denies any chest pain.  Objective: Filed Vitals:   08/12/12 2109 08/12/12 2300 08/13/12 0234 08/13/12 0631  BP: 114/72 105/81  155/83  Pulse: 146 71  86  Temp:  97.6 F (36.4 C) 97.2 F (36.2 C) 97.1 F (36.2 C)  TempSrc:  Rectal Axillary Axillary  Resp: 24 24  22   Height:      Weight:    52.98 kg (116  lb 12.8 oz)  SpO2: 93% 97%  92%    Intake/Output Summary (Last 24 hours) at 08/13/12 0937 Last data filed at 08/13/12 9562  Gross per 24 hour  Intake      0 ml  Output   1350 ml  Net  -1350 ml   Filed Weights   08/12/12 1900 08/13/12 0631  Weight: 53.524 kg (118 lb) 52.98 kg (116 lb 12.8 oz)    Exam:   General:  NAD  Cardiovascular: RRR with 3/6 SEM. + JVD. Trace BLE edema  Respiratory: Diffuse crackles  Abdomen: Soft, NT.ND,+BS  Data Reviewed: Basic Metabolic Panel:  Lab 08/13/12 1308 08/12/12 1608  NA 142 136  K 3.8 4.9  CL 99 102  CO2 31 25  GLUCOSE 99 92  BUN 31* 28*  CREATININE 1.14* 1.03  CALCIUM 9.7 9.9  MG -- --  PHOS -- --   Liver Function Tests:  Lab 08/12/12 1608  AST 34  ALT 16  ALKPHOS 131*  BILITOT 0.5  PROT 7.4  ALBUMIN 3.2*   No results found for this basename: LIPASE:5,AMYLASE:5 in the last 168 hours No results found for this basename: AMMONIA:5 in the last 168 hours CBC:  Lab 08/12/12 1608  WBC 6.8  NEUTROABS 4.7  HGB 16.3*  HCT 50.5*  MCV 90.2  PLT 138*   Cardiac Enzymes:  Lab 08/12/12 1608  CKTOTAL --  CKMB --  CKMBINDEX --  TROPONINI <0.30   BNP (last 3 results)  Basename 08/12/12 1609  PROBNP 6337.0*   CBG: No results found for this basename: GLUCAP:5 in the last 168 hours  Recent Results (from the past 240 hour(s))  MRSA PCR SCREENING     Status: Abnormal   Collection Time   08/13/12 12:46 AM      Component Value Range Status Comment   MRSA by PCR POSITIVE (*) NEGATIVE Final      Studies: Dg Chest 2 View  08/12/2012  *RADIOLOGY REPORT*  Clinical Data: Tachycardia and shortness of breath.  CHEST - 2 VIEW  Comparison: Two-view chest 07/01/2011.  Findings: The heart is enlarged.  Mild interstitial edema is new. Mild bibasilar atelectasis is evident.  Small bilateral pleural effusions are evident.  Exaggerated thoracic kyphosis is stable. Postsurgical changes are again noted in the right axilla.  IMPRESSION:   1.  Progressive congestive heart failure. 2.  Increasing bibasilar airspace disease likely reflects atelectasis. 3.  Postsurgical changes of the right axilla.   Original Report Authenticated By: Marin Roberts, M.D.     Scheduled Meds:   . aspirin EC  81 mg Oral Daily  . Chlorhexidine Gluconate Cloth  6 each Topical Q0600  . furosemide  20 mg Intravenous Q12H  . metoprolol tartrate  25 mg Oral BID  . mirtazapine  7.5 mg Oral QHS  . mupirocin ointment  1 application Nasal BID  . potassium chloride  10 mEq Oral Daily  . sodium chloride  3 mL Intravenous Q12H   Continuous Infusions:   Principal Problem:  *Acute heart failure Active Problems:  Aortic stenosis with mitral and aortic insufficiency  Brain aneurysm    Time spent: > 30 mins    [facility]  [facility] Pager [phone]. If 8PM-8AM, please contact night-coverage at [email or web address], password [facility] 08/13/2012, 9:37 AM  LOS: 1 day

## 2012-08-14 DIAGNOSIS — N189 Chronic kidney disease, unspecified: Secondary | ICD-10-CM

## 2012-08-14 DIAGNOSIS — N289 Disorder of kidney and ureter, unspecified: Secondary | ICD-10-CM

## 2012-08-14 DIAGNOSIS — I5033 Acute on chronic diastolic (congestive) heart failure: Secondary | ICD-10-CM

## 2012-08-14 LAB — CBC
Hemoglobin: 15.3 g/dL — ABNORMAL HIGH (ref 12.0–15.0)
MCH: 29.3 pg (ref 26.0–34.0)
MCHC: 31.9 g/dL (ref 30.0–36.0)
RDW: 17 % — ABNORMAL HIGH (ref 11.5–15.5)

## 2012-08-14 LAB — BASIC METABOLIC PANEL
BUN: 34 mg/dL — ABNORMAL HIGH (ref 6–23)
Creatinine, Ser: 1.42 mg/dL — ABNORMAL HIGH (ref 0.50–1.10)
GFR calc Af Amer: 35 mL/min — ABNORMAL LOW (ref >90)
GFR calc non Af Amer: 31 mL/min — ABNORMAL LOW (ref >90)
Glucose, Bld: 101 mg/dL — ABNORMAL HIGH (ref 70–99)
Potassium: 4 mEq/L (ref 3.5–5.1)

## 2012-08-14 MED ORDER — MUPIROCIN 2 % EX OINT: 1.0000 "application " | TOPICAL_OINTMENT | Freq: Two times a day (BID) | CUTANEOUS | Status: AC

## 2012-08-14 MED ORDER — FUROSEMIDE 20 MG PO TABS
20.0000 mg | ORAL_TABLET | Freq: Every day | ORAL | Status: DC
  Administered 2012-08-14: 20 mg via ORAL
  Filled 2012-08-14: qty 1

## 2012-08-14 MED ORDER — FUROSEMIDE 20 MG PO TABS: 20.0000 mg | ORAL_TABLET | Freq: Every day | ORAL | Status: AC

## 2012-08-14 NOTE — Progress Notes (Signed)
Patient cleared for discharge. Packet copied and placed in [place]. CSW spoke with Wille Celeste at [facility], they are ready to accept patient. CSW spoke with patient's daughter, ms. Yetta Barre, she is agreeable to transfer. ptar called for transportation.  Anabeth Chilcott C. Kimoni Pagliarulo MSW, LCSW [phone]

## 2012-08-14 NOTE — Clinical Social Work Psychosocial (Unsigned)
     Clinical Social Work Department BRIEF PSYCHOSOCIAL ASSESSMENT 08/14/2012  Patient:  [name], [name]     Account Number:  [id number]     Admit date:  08/12/2012  Clinical Social Worker:  Hattie Perch  Date/Time:  08/14/2012 12:00 M  Referred by:  Physician  Date Referred:  08/14/2012 Referred for  SNF Placement   Other Referral:   Interview type:  Patient Other interview type:   niece at bedside.    PSYCHOSOCIAL DATA Living Status:  FACILITY Admitted from facility:  [facility] Level of care:  Skilled Nursing Facility Primary support name:  Britta Mccreedy Primary support relationship to patient:  CHILD, ADULT Degree of support available:   good    CURRENT CONCERNS Current Concerns  Post-Acute Placement   Other Concerns:    SOCIAL WORK ASSESSMENT / PLAN CSW met with patient and niece at bedside. patient is alert and oriented X3. patient is from [facility] snf and is agreeable to returning.   Assessment/plan status:   Other assessment/ plan:   Information/referral to community resources:    PATIENTS/FAMILYS RESPONSE TO PLAN OF CARE: agreeable to return to [facility].

## 2012-08-14 NOTE — Evaluation (Signed)
Physical Therapy One Time Evaluation Patient Details Name: [name] MRN: [id number] DOB: [birth year] Today's Date: 08/14/2012 Time: 6213-0865 PT Time Calculation (min): 23 min  PT Assessment / Plan / Recommendation Clinical Impression  Pt admitted for acute on chronic diastolic congestive heart failure.  Pt doing very well with mobility and currently at her baseline.  Pt reports using rollator at facility and no hx of falls.  Pt SaO2 on room air at rest 97%, during ambulation room air 93%, and upon sitting in recliner after ambulation 98% so notified RN that pt left off oxgyen.  Pt has no PT needs at this time.    PT Assessment  Patent does not need any further PT services    Follow Up Recommendations  No PT follow up    Does the patient have the potential to tolerate intense rehabilitation      Barriers to Discharge        Equipment Recommendations  None recommended by PT    Recommendations for Other Services     Frequency      Precautions / Restrictions Precautions Precautions: None Restrictions Weight Bearing Restrictions: No   Pertinent Vitals/Pain No pain      Mobility  Bed Mobility Bed Mobility: Supine to Sit Supine to Sit: 6: Modified independent (Device/Increase time);HOB elevated Transfers Transfers: Sit to Stand;Stand to Sit Sit to Stand: 5: Supervision;With upper extremity assist;From bed Stand to Sit: 5: Supervision;With upper extremity assist;To chair/3-in-1 Details for Transfer Assistance: pt educated on safe technique with hand placement Ambulation/Gait Ambulation/Gait Assistance: 4: Min guard;5: Supervision Ambulation Distance (Feet): 280 Feet Assistive device: Rolling walker Ambulation/Gait Assistance Details: pt does well with RW, reports ambulation very close to baseline as she prefers her rollator to RW Gait Pattern: Within Functional Limits Gait velocity: variable, can ambulate fast pace safely General Gait Details: no unsteady gait or LOB      Shoulder Instructions     Exercises     PT Diagnosis:    PT Problem List:   PT Treatment Interventions:     PT Goals    Visit Information  Last PT Received On: 08/14/12 Assistance Needed: +1    Subjective Data  Subjective: This is not like the one I have.  (RW, pt has rollator)   Prior Functioning  Home Living Type of Home: Assisted living Home Adaptive Equipment: Straight cane;Other (comment) (rollator) Additional Comments: Pt reports she lives at [facility] and occasionally ambulates to meals but can cook. Prior Function Level of Independence: Independent with assistive device(s) Communication Communication: No difficulties    Cognition  Overall Cognitive Status: Appears within functional limits for tasks assessed/performed Arousal/Alertness: Awake/alert Behavior During Session: [facility] for tasks performed    Extremity/Trunk Assessment Right Lower Extremity Assessment RLE ROM/Strength/Tone: [facility] for tasks assessed Left Lower Extremity Assessment LLE ROM/Strength/Tone: [facility] for tasks assessed   Balance    End of Session PT - End of Session Activity Tolerance: Patient tolerated treatment well Patient left: in chair;with call bell/phone within reach Nurse Communication: Other (comment) (pt up in chair, left O2 off)  GP     Tanay Massiah,KATHrine E 08/14/2012, 10:03 AM Pager: [phone]

## 2012-08-14 NOTE — Progress Notes (Signed)
Report called to [facility] RN at [facility]. Discharge instructions reviewed and all questions answered. Julio Sicks RN

## 2012-08-14 NOTE — Evaluation (Signed)
Occupational Therapy Evaluation Patient Details Name: [name] MRN: [id number] DOB: 01-31-1916 Today's Date: 08/14/2012 Time: 1478-2956 OT Time Calculation (min): 28 min  OT Assessment / Plan / Recommendation Clinical Impression  Pt admitted for acute on chronic diastolic congestive heart failure. Pt doing well with functional mobility and ADLs. Feel pt is close to baseline and is safe to return to her ALF.    OT Assessment  Patient does not need any further OT services    Follow Up Recommendations  No OT follow up    Barriers to Discharge      Equipment Recommendations  None recommended by OT    Recommendations for Other Services    Frequency       Precautions / Restrictions Precautions Precautions: None Restrictions Weight Bearing Restrictions: No   Pertinent Vitals/Pain Denied pain. O2 sats remained in mid 90s on RA with activity.    ADL  Grooming: Supervision/safety Where Assessed - Grooming: Supported standing Upper Body Bathing: Set up Where Assessed - Upper Body Bathing: Unsupported sitting Lower Body Bathing: Set up Where Assessed - Lower Body Bathing: Supported sit to stand Upper Body Dressing: Set up Where Assessed - Upper Body Dressing: Unsupported sitting Lower Body Dressing: Set up Where Assessed - Lower Body Dressing: Supported sit to Pharmacist, hospital: Supervision/safety Statistician Method: Sit to stand Toileting - Architect and Hygiene: Supervision/safety Where Assessed - Engineer, mining and Hygiene: Sit to stand from 3-in-1 or toilet Equipment Used: Rolling walker ADL Comments: Pt does not require any physical assist for ADLs at this time. Good activity tolerance.    OT Diagnosis:    OT Problem List:   OT Treatment Interventions:     OT Goals    Visit Information  Last OT Received On: 08/14/12 Assistance Needed: +1 PT/OT Co-Evaluation/Treatment: Yes    Subjective Data  Subjective: I haven't even had  breakfast yet. Patient Stated Goal: Not asked.   Prior Functioning     Home Living Type of Home: Assisted living Bathroom Shower/Tub: Walk-in shower Bathroom Toilet: Standard Home Adaptive Equipment: Straight cane;Walker - four wheeled;Grab bars in shower;Grab bars around toilet;Built-in shower seat Additional Comments: Pt reports she lives at [facility] and occasionally ambulates to meals but can cook. Prior Function Level of Independence: Independent with assistive device(s) Driving: No Vocation: Retired Musician: No difficulties Dominant Hand: Right         Vision/Perception     Cognition  Overall Cognitive Status: No family/caregiver present to determine baseline cognitive functioning Arousal/Alertness: Awake/alert Orientation Level: Appears intact for tasks assessed Behavior During Session: [facility] for tasks performed Cognition - Other Comments: Pt occasionally slow to process commands and is distractible at times but easily re-directed.    Extremity/Trunk Assessment Right Upper Extremity Assessment RUE ROM/Strength/Tone: [facility] for tasks assessed Left Upper Extremity Assessment LUE ROM/Strength/Tone: WFL for tasks assessed Right Lower Extremity Assessment RLE ROM/Strength/Tone: WFL for tasks assessed Left Lower Extremity Assessment LLE ROM/Strength/Tone: [facility] for tasks assessed     Mobility Bed Mobility Bed Mobility: Supine to Sit Supine to Sit: 6: Modified independent (Device/Increase time);HOB elevated Transfers Sit to Stand: 5: Supervision;With upper extremity assist;From bed Stand to Sit: 5: Supervision;With upper extremity assist;To chair/3-in-1 Details for Transfer Assistance: pt educated on safe technique with hand placement     Shoulder Instructions     Exercise     Balance     End of Session OT - End of Session Activity Tolerance: Patient tolerated treatment well Patient left:  in chair;with call bell/phone within reach    GO     Sani Madariaga A OTR/L [phone] 08/14/2012, 10:10 AM

## 2012-08-14 NOTE — Progress Notes (Signed)
   Subjective:  Denies CP or dyspnea   Objective:  Filed Vitals:   08/13/12 0631 08/13/12 1429 08/13/12 2108 08/14/12 0509  BP: 155/83 112/46 128/66 110/64  Pulse: 86 77 78 73  Temp: 97.1 F (36.2 C) 97.5 F (36.4 C) 97.9 F (36.6 C) 97.3 F (36.3 C)  TempSrc: Axillary Oral Oral Oral  Resp: 22 18 21 20   Height:      Weight: 116 lb 12.8 oz (52.98 kg)   116 lb 3.2 oz (52.708 kg)  SpO2: 92% 90% 97% 94%    Intake/Output from previous day:  Intake/Output Summary (Last 24 hours) at 08/14/12 0656 Last data filed at 08/14/12 0516  Gross per 24 hour  Intake    360 ml  Output   1425 ml  Net  -1065 ml    Physical Exam: Physical exam: Well-developed frail in no acute distress.  Skin is warm and dry.  HEENT is normal.  Neck is supple.  Chest is clear to auscultation with normal expansion.  Cardiovascular exam is regular rate and rhythm. 3/6 systolic murmur Abdominal exam nontender or distended.  Extremities show no edema. neuro grossly intact    Lab Results: Basic Metabolic Panel:  Basename 08/14/12 0453 08/13/12 0450  NA 141 142  K 4.0 3.8  CL 101 99  CO2 33* 31  GLUCOSE 101* 99  BUN 34* 31*  CREATININE 1.42* 1.14*  CALCIUM 9.4 9.7  MG -- --  PHOS -- --   CBC:  Basename 08/14/12 0453 08/12/12 1608  WBC 9.1 6.8  NEUTROABS -- 4.7  HGB 15.3* 16.3*  HCT 47.9* 50.5*  MCV 91.8 90.2  PLT 132* 138*   Cardiac Enzymes:  Basename 08/12/12 1608  CKTOTAL --  CKMB --  CKMBINDEX --  TROPONINI <0.30     Assessment/Plan:  1 acute on chronic diastolic congestive heart failure-the patient is improved with diuretics. She is back to baseline. She can be discharged on preadmission dose of Lasix which is 20 mg daily. She can take an additional 20 mg daily as needed for increasing shortness of breath or edema. 2 aortic stenosis-conservative measures given age and overall medical condition. 3 acute on chronic renal failure-most likely related to increasing diuretic  dose. Resume low-dose Lasix as outlined above. Patient can be discharged from a cardiac standpoint and followup with Dr. Patty Sermons in approximately 4 weeks. Please call with questions.  Olga Millers 08/14/2012, 6:56 AM

## 2012-08-14 NOTE — Discharge Summary (Signed)
Physician Discharge Summary  Katrinia Straker NWG:[id number] DOB: 1915/08/20 DOA: 08/12/2012  PCP: Kimber Relic, MD  Admit date: 08/12/2012 Discharge date: 08/14/2012  Time spent: 65 minutes  Recommendations for Outpatient Follow-up:  1. Patient will be discharged back to the nursing facility. Patient will need a basic metabolic profile done one week post discharge to followup on electrolytes and renal function. Patient will followup with M.D. at the nursing facility. 2. Patient is to followup with Dr. Patty Sermons of cardiology in 2 weeks. A followup basic metabolic profile beta be obtained to followup on patient's electrolytes and renal function. Patient's heart failure need to be reassessed at that time.  Discharge Diagnoses:  Principal Problem:  *Acute systolic HF (heart failure) Active Problems:  Dyspnea  Aortic stenosis with mitral and aortic insufficiency  Brain aneurysm  Acute heart failure  Acute on chronic diastolic heart failure  Acute on chronic renal insufficiency   Discharge Condition: Stable and improved  Diet recommendation: Heart healthy diet. Low-salt diet.  Filed Weights   08/12/12 1900 08/13/12 0631 08/14/12 0509  Weight: 53.524 kg (118 lb) 52.98 kg (116 lb 12.8 oz) 52.708 kg (116 lb 3.2 oz)    History of present illness:  79 year old woman past medical history severe aortic stenosis, pulmonary hypertension presented with increased shortness of breath today, tachycardia; initial workup suggested acute heart failure.  Patient resides at [facility] where she receives physical therapy. This morning she felt a little short of breath, however her daughter noted that her shortness of breath did not affect her ability to speak in full sentences. During physical therapy she was noted to be tachycardic. Even with rest or tachycardia did not improve and she was given a dose of Lopressor. Despite this tachycardia persisted and she was referred to the emergency department.  In  general patient has felt well of late. She denies chest pain, left arm, neck or jaw pain, diaphoresis. She felt mildly short of breath earlier today. She took her medications today.  In the emergency department was noted to be tachycardic, tachypneic, afebrile with normal blood pressure. Initial workup was notable for BNP 6337, chest x-ray revealing progressive congestive heart failure and EKG revealing narrow complex tachycardia as below. EDP discussed with cardiologist Dr. Patty Sermons who suggested Lasix. Avoid beta blockers secondary to severe aortic stenosis per EDP discussion with cardiology. She was given a dose of Lasix with approximately 150 mL of urine returned.   Hospital Course:  #1 acute on chronic diastolic CHF Patient was admitted with acute on chronic diastolic CHF. BNP on admission was noted to be elevated at 6337. Chest x-ray which was done was consistent with progressive congestive heart failure. EKG showed a narrow complex tachycardia. Patient was monitored placed on the telemetry floor placed on IV Lasix and beta blocker and followed. Initial troponin obtained was less than 0.30. A 2-D echo was also obtained which showed normal left ventricular size and mild LVH. EF of 50% with basal inferior akinesia is. D-shaped interventricular septum suggestive of right ventricular pressure/volume overload. Moderately dilated right ventricle with mild-to-moderate systolic dysfunction. Mild pulmonary hypertension. Severe tricuspid regurgitation. Aortic and mitral valves severely calcified. Aortic valve looks severely stenosed but stenosis was moderate by calculated value area of 1.31 cm. Cardiology consultation was obtained and patient was seen in consultation by Dr. Antoine Poche. Patient improved clinically and she was subsequently transitioned back to home dose of Lasix 20 mg daily. Recommended per cardiology that patient may take an extra Lasix 20 mg daily  for worsening shortness of breath and edema.  Patient was stable from a cardiac standpoint to be discharged per cardiology. Patient be discharged back to the facility in stable and improved condition and is to followup with Dr. Patty Sermons in approximately 2 weeks. Patient be discharged in stable and improved condition.  #2 acute on chronic renal insufficiency During the hospitalization patient was noted to have a bump in her renal function with a creatinine going up to 1.42. It was felt this is likely secondary to IV diuresis. Patient's IV Lasix have been changed to oral Lasix. Patient will need followup basic metabolic profile done in about a week to followup on her renal function. Patient be discharged in stable and improved condition.  The rest of patient's chronic medical issues remained stable throughout the hospitalization and patient discharged in stable and improved condition.  Procedures:  2-D echo 08/13/2012  Consultations:  Cardiology: Dr. Antoine Poche 08/13/2012  Discharge Exam: Filed Vitals:   08/13/12 1429 08/13/12 2108 08/14/12 0509 08/14/12 0910  BP: 112/46 128/66 110/64   Pulse: 77 78 73   Temp: 97.5 F (36.4 C) 97.9 F (36.6 C) 97.3 F (36.3 C)   TempSrc: Oral Oral Oral   Resp: 18 21 20    Height:      Weight:   52.708 kg (116 lb 3.2 oz)   SpO2: 90% 97% 94% 98%    General: NAD Cardiovascular: RRR with 3/6 systolic murmur Respiratory: CTAB  Discharge Instructions  Discharge Orders    Future Orders Please Complete By Expires   Diet - low sodium heart healthy      Increase activity slowly      Discharge instructions      Comments:   Followup with Dr. Patty Sermons in 2 weeks. Followup with M.D. at facility.       Medication List     As of 08/14/2012 10:01 AM    TAKE these medications         aspirin 81 MG EC tablet   Take 1 tablet (81 mg total) by mouth daily.      Calcium-D 600-400 MG-UNIT Tabs   Take 1 tablet by mouth daily.      cholecalciferol 1000 UNITS tablet   Commonly known as: VITAMIN D     Take 1,000 Units by mouth daily.      furosemide 20 MG tablet   Commonly known as: LASIX   Take 1 tablet (20 mg total) by mouth daily. May take an extra dose of lasix for increasing SOB or edema      ICAPS Caps   Take 1 capsule by mouth daily.      lactose free nutrition Liqd   Take 237 mLs by mouth 2 (two) times daily with a meal.      meclizine 25 MG tablet   Commonly known as: ANTIVERT   Take 25 mg by mouth 3 (three) times daily as needed. For vertigo      metoprolol tartrate 25 MG tablet   Commonly known as: LOPRESSOR   Take 25 mg by mouth 2 (two) times daily.      mirtazapine 15 MG tablet   Commonly known as: REMERON   Take 7.5 mg by mouth at bedtime. Take 1/2 tablet at bedtime      multivitamin with minerals Tabs   Take 1 tablet by mouth daily. Centrum      mupirocin ointment 2 %   Commonly known as: BACTROBAN   Apply 1 application topically 2 (two) times  daily. Place application into nose 2 times daily. For Bactroban nasal-with cotton tip swab apply pea-sized amount into each Darene Lamer. Gently press sides of nose together to spread ointment. Avoid contact with eyes.  Use for 4 days      nitroGLYCERIN 0.4 MG SL tablet   Commonly known as: NITROSTAT   Place 1 tablet (0.4 mg total) under the tongue every 5 (five) minutes as needed for chest pain.      polyethylene glycol packet   Commonly known as: MIRALAX / GLYCOLAX   Take 17 g by mouth daily as needed. For constipation.      potassium chloride 10 MEQ tablet   Commonly known as: K-DUR   Take 10 mEq by mouth daily.           Follow-up Information    Follow up with Cassell Clement, MD. Schedule an appointment as soon as possible for a visit in 2 weeks. (Make an appointment to followup with Dr. Patty Sermons in 2 weeks)    Contact information:   [address] [place] [address] [phone]       Please follow up. (Followup with M.D. at facility.)           The results of significant  diagnostics from this hospitalization (including imaging, microbiology, ancillary and laboratory) are listed below for reference.    Significant Diagnostic Studies: Dg Chest 2 View  08/12/2012  *RADIOLOGY REPORT*  Clinical Data: Tachycardia and shortness of breath.  CHEST - 2 VIEW  Comparison: Two-view chest 07/01/2011.  Findings: The heart is enlarged.  Mild interstitial edema is new. Mild bibasilar atelectasis is evident.  Small bilateral pleural effusions are evident.  Exaggerated thoracic kyphosis is stable. Postsurgical changes are again noted in the right axilla.  IMPRESSION:  1.  Progressive congestive heart failure. 2.  Increasing bibasilar airspace disease likely reflects atelectasis. 3.  Postsurgical changes of the right axilla.   Original Report Authenticated By: Marin Roberts, M.D.     Microbiology: Recent Results (from the past 240 hour(s))  MRSA PCR SCREENING     Status: Abnormal   Collection Time   08/13/12 12:46 AM      Component Value Range Status Comment   MRSA by PCR POSITIVE (*) NEGATIVE Final      Labs: Basic Metabolic Panel:  Lab 08/14/12 9147 08/13/12 0450 08/12/12 1608  NA 141 142 136  K 4.0 3.8 4.9  CL 101 99 102  CO2 33* 31 25  GLUCOSE 101* 99 92  BUN 34* 31* 28*  CREATININE 1.42* 1.14* 1.03  CALCIUM 9.4 9.7 9.9  MG -- -- --  PHOS -- -- --   Liver Function Tests:  Lab 08/12/12 1608  AST 34  ALT 16  ALKPHOS 131*  BILITOT 0.5  PROT 7.4  ALBUMIN 3.2*   No results found for this basename: LIPASE:5,AMYLASE:5 in the last 168 hours No results found for this basename: AMMONIA:5 in the last 168 hours CBC:  Lab 08/14/12 0453 08/12/12 1608  WBC 9.1 6.8  NEUTROABS -- 4.7  HGB 15.3* 16.3*  HCT 47.9* 50.5*  MCV 91.8 90.2  PLT 132* 138*   Cardiac Enzymes:  Lab 08/12/12 1608  CKTOTAL --  CKMB --  CKMBINDEX --  TROPONINI <0.30   BNP: BNP (last 3 results)  Basename 08/12/12 1609  PROBNP 6337.0*   CBG: No results found for this basename:  GLUCAP:5 in the last 168 hours     Signed:  [facility]  [facility]  08/14/2012, 10:01 AM

## 2012-08-17 ENCOUNTER — Encounter (HOSPITAL_COMMUNITY): Payer: Self-pay | Admitting: Emergency Medicine

## 2012-08-17 ENCOUNTER — Emergency Department (HOSPITAL_COMMUNITY): Payer: Medicare Other

## 2012-08-17 ENCOUNTER — Inpatient Hospital Stay (HOSPITAL_COMMUNITY)
Admission: EM | Admit: 2012-08-17 | Discharge: 2012-08-20 | DRG: 309 | Disposition: A | Discharge status: Skilled Nursing Facility | Payer: Medicare Other | Attending: Internal Medicine | Admitting: Internal Medicine

## 2012-08-17 ENCOUNTER — Other Ambulatory Visit: Payer: Self-pay

## 2012-08-17 ENCOUNTER — Telehealth: Payer: Self-pay | Admitting: Cardiology

## 2012-08-17 DIAGNOSIS — Z79899 Other long term (current) drug therapy: Secondary | ICD-10-CM

## 2012-08-17 DIAGNOSIS — I5032 Chronic diastolic (congestive) heart failure: Secondary | ICD-10-CM

## 2012-08-17 DIAGNOSIS — Z66 Do not resuscitate: Secondary | ICD-10-CM | POA: Diagnosis present

## 2012-08-17 DIAGNOSIS — F0390 Unspecified dementia without behavioral disturbance: Secondary | ICD-10-CM | POA: Diagnosis present

## 2012-08-17 DIAGNOSIS — I471 Supraventricular tachycardia, unspecified: Principal | ICD-10-CM | POA: Diagnosis present

## 2012-08-17 DIAGNOSIS — D696 Thrombocytopenia, unspecified: Secondary | ICD-10-CM | POA: Diagnosis present

## 2012-08-17 DIAGNOSIS — I5042 Chronic combined systolic (congestive) and diastolic (congestive) heart failure: Secondary | ICD-10-CM | POA: Diagnosis present

## 2012-08-17 DIAGNOSIS — I4719 Other supraventricular tachycardia: Secondary | ICD-10-CM

## 2012-08-17 DIAGNOSIS — I5022 Chronic systolic (congestive) heart failure: Secondary | ICD-10-CM | POA: Diagnosis present

## 2012-08-17 DIAGNOSIS — I272 Pulmonary hypertension, unspecified: Secondary | ICD-10-CM | POA: Diagnosis present

## 2012-08-17 DIAGNOSIS — N189 Chronic kidney disease, unspecified: Secondary | ICD-10-CM

## 2012-08-17 DIAGNOSIS — I2789 Other specified pulmonary heart diseases: Secondary | ICD-10-CM

## 2012-08-17 DIAGNOSIS — I509 Heart failure, unspecified: Secondary | ICD-10-CM | POA: Diagnosis present

## 2012-08-17 DIAGNOSIS — I498 Other specified cardiac arrhythmias: Secondary | ICD-10-CM | POA: Diagnosis present

## 2012-08-17 DIAGNOSIS — I080 Rheumatic disorders of both mitral and aortic valves: Secondary | ICD-10-CM

## 2012-08-17 DIAGNOSIS — Z853 Personal history of malignant neoplasm of breast: Secondary | ICD-10-CM

## 2012-08-17 DIAGNOSIS — N183 Chronic kidney disease, stage 3 unspecified: Secondary | ICD-10-CM | POA: Diagnosis present

## 2012-08-17 DIAGNOSIS — R000 Tachycardia, unspecified: Secondary | ICD-10-CM

## 2012-08-17 DIAGNOSIS — I5033 Acute on chronic diastolic (congestive) heart failure: Secondary | ICD-10-CM

## 2012-08-17 DIAGNOSIS — R42 Dizziness and giddiness: Secondary | ICD-10-CM

## 2012-08-17 HISTORY — DX: Cerebrovascular disease, unspecified: I67.9

## 2012-08-17 HISTORY — DX: Chronic systolic (congestive) heart failure: I50.22

## 2012-08-17 HISTORY — DX: Chronic kidney disease, unspecified: N18.9

## 2012-08-17 LAB — CBC
HCT: 49.4 % — ABNORMAL HIGH (ref 36.0–46.0)
MCH: 29.7 pg (ref 26.0–34.0)
MCV: 91.1 fL (ref 78.0–100.0)
Platelets: 131 10*3/uL — ABNORMAL LOW (ref 150–400)
RBC: 5.42 MIL/uL — ABNORMAL HIGH (ref 3.87–5.11)

## 2012-08-17 LAB — POCT I-STAT TROPONIN I

## 2012-08-17 LAB — BASIC METABOLIC PANEL
CO2: 22 mEq/L (ref 19–32)
Calcium: 10.1 mg/dL (ref 8.4–10.5)
Glucose, Bld: 126 mg/dL — ABNORMAL HIGH (ref 70–99)
Sodium: 140 mEq/L (ref 135–145)

## 2012-08-17 MED ORDER — VITAMIN D3 25 MCG (1000 UNIT) PO TABS
1000.0000 [IU] | ORAL_TABLET | Freq: Every day | ORAL | Status: DC
  Administered 2012-08-18 – 2012-08-20 (×3): 1000 [IU] via ORAL
  Filled 2012-08-17 (×3): qty 1

## 2012-08-17 MED ORDER — METOPROLOL TARTRATE 50 MG PO TABS
50.0000 mg | ORAL_TABLET | Freq: Two times a day (BID) | ORAL | Status: DC
  Administered 2012-08-18 – 2012-08-20 (×5): 50 mg via ORAL
  Filled 2012-08-17 (×6): qty 1

## 2012-08-17 MED ORDER — SODIUM CHLORIDE 0.9 % IJ SOLN: 3.0000 mL | INTRAMUSCULAR | Status: DC | PRN

## 2012-08-17 MED ORDER — BOOST PO LIQD
237.0000 mL | Freq: Two times a day (BID) | ORAL | Status: DC
  Administered 2012-08-18 – 2012-08-19 (×4): 237 mL via ORAL
  Filled 2012-08-17 (×6): qty 237

## 2012-08-17 MED ORDER — SODIUM CHLORIDE 0.9 % IJ SOLN
3.0000 mL | Freq: Two times a day (BID) | INTRAMUSCULAR | Status: DC
  Administered 2012-08-17 – 2012-08-19 (×4): 3 mL via INTRAVENOUS

## 2012-08-17 MED ORDER — SODIUM CHLORIDE 0.9 % IV SOLN: 250.0000 mL | INTRAVENOUS | Status: DC | PRN

## 2012-08-17 MED ORDER — FUROSEMIDE 20 MG PO TABS
20.0000 mg | ORAL_TABLET | Freq: Every day | ORAL | Status: DC
  Administered 2012-08-18 – 2012-08-20 (×3): 20 mg via ORAL
  Filled 2012-08-17 (×3): qty 1

## 2012-08-17 MED ORDER — ADENOSINE 6 MG/2ML IV SOLN
6.0000 mg | Freq: Once | INTRAVENOUS | Status: DC
  Administered 2012-08-17: 6 mg via INTRAVENOUS
  Filled 2012-08-17: qty 4
  Filled 2012-08-17: qty 2

## 2012-08-17 MED ORDER — ONDANSETRON HCL 4 MG/2ML IJ SOLN: 4.0000 mg | Freq: Three times a day (TID) | INTRAMUSCULAR | Status: DC | PRN

## 2012-08-17 MED ORDER — MUPIROCIN 2 % EX OINT
1.0000 "application " | TOPICAL_OINTMENT | Freq: Two times a day (BID) | CUTANEOUS | Status: DC
  Administered 2012-08-17 – 2012-08-20 (×6): 1 via TOPICAL
  Filled 2012-08-17: qty 22

## 2012-08-17 MED ORDER — SODIUM CHLORIDE 0.9 % IJ SOLN: 3.0000 mL | Freq: Two times a day (BID) | INTRAMUSCULAR | Status: DC

## 2012-08-17 MED ORDER — METOPROLOL TARTRATE 25 MG PO TABS
50.0000 mg | ORAL_TABLET | Freq: Once | ORAL | Status: DC
  Administered 2012-08-17: 50 mg via ORAL
  Filled 2012-08-17: qty 2

## 2012-08-17 MED ORDER — ADENOSINE 6 MG/2ML IV SOLN
12.0000 mg | Freq: Once | INTRAVENOUS | Status: AC
  Administered 2012-08-17: 12 mg via INTRAVENOUS

## 2012-08-17 MED ORDER — ASPIRIN EC 81 MG PO TBEC
81.0000 mg | DELAYED_RELEASE_TABLET | Freq: Every day | ORAL | Status: DC
  Administered 2012-08-18 – 2012-08-20 (×3): 81 mg via ORAL
  Filled 2012-08-17 (×3): qty 1

## 2012-08-17 MED ORDER — CHLORHEXIDINE GLUCONATE CLOTH 2 % EX PADS
6.0000 | MEDICATED_PAD | Freq: Every day | CUTANEOUS | Status: DC
  Administered 2012-08-18 – 2012-08-20 (×3): 6 via TOPICAL

## 2012-08-17 MED ORDER — MIRTAZAPINE 7.5 MG PO TABS
7.5000 mg | ORAL_TABLET | Freq: Every day | ORAL | Status: DC
  Administered 2012-08-17 – 2012-08-19 (×3): 7.5 mg via ORAL
  Filled 2012-08-17 (×4): qty 1

## 2012-08-17 MED ORDER — POTASSIUM CHLORIDE ER 10 MEQ PO TBCR
10.0000 meq | EXTENDED_RELEASE_TABLET | Freq: Every day | ORAL | Status: DC
  Filled 2012-08-17: qty 1

## 2012-08-17 MED ORDER — METOPROLOL TARTRATE 1 MG/ML IV SOLN
5.0000 mg | Freq: Once | INTRAVENOUS | Status: AC
  Administered 2012-08-17: 5 mg via INTRAVENOUS
  Filled 2012-08-17: qty 5

## 2012-08-17 NOTE — Progress Notes (Signed)
CSW left message for [facility] regarding patient admission. Marland Kitchen  Catha Gosselin, LCSWA  [phone] 08/17/2012 1639pm

## 2012-08-17 NOTE — Telephone Encounter (Signed)
Received a call from Vikki Ports, nurse at facility. Patient just d/c from hospital on Friday.  Nurse states patient O2 sat 93 % on 2 liters, R 20-22 labored per nurse but not patient, and heart rate 140's-150's. Was told by on call last night to watch patient and call office this am.  Patient continues with rapid heart rate. Discussed with Dawayne Patricia NP and recommendations are for patient to go to ED. Called Vikki Ports and advised, verbalized understanding.

## 2012-08-17 NOTE — Consult Note (Signed)
CARDIOLOGY CONSULT NOTE    Patient ID: Shaena Parkerson MRN: [id number] DOB/AGE: 77-Jul-1917 35 y.o.  Admit date: 08/17/2012 Primary Physician  Art Chilton Si MD Primary Cardiologist  Cassell Clement MD Reason for Consultation Tachycardia  HPI: 79 yo WF resident of SNF recently discharged from hospitalist service on 08/14/12. She was admitted on 08/12/12 with tachycardia and CHF exacerbation. She was diuresed. No monitor strips availible for review but vitals showed HR in 70s-80s during her hospital stay. Initial Ecg on 08/12/12 showed a regular narrow complex tachycardia with rate 132 bpm. No p waves seen. She was discharged on metoprolol 25 mg bid. Today nursing staff noted her HR elevated again and she was referred back to the ED. She denies any increase in SOB or CP. She is unaware of elevated HR. She has dementia. In ED she is noted to have recurrent narrow complex tachycardia with rate 136-140 bpm. She was given 5 mg IV lopressor without effect. She was then given 6 mg then 12 mg of adenosine without effect. Please complete cardiology consult on 08/13/12 by Dr. Antoine Poche.  Review of systems complete and found to be negative unless listed above   Past Medical History  Diagnosis Date  . Aortic stenosis   . Mitral regurgitation   . Tricuspid regurgitation   . Pulmonary hypertension   . LVH (left ventricular hypertrophy)   . Fatigue   . History of dizziness   . Leg cramps   . Breast cancer   . Cerebrovascular disease   . Mitral stenosis     Family History  Problem Relation Age of Onset  . Stroke Mother   . Cancer Father   . Cancer Sister   . Heart disease Brother     History   Social History  . Marital Status: Widowed    Spouse Name: N/A    Number of Children: N/A  . Years of Education: N/A   Occupational History  . Not on file.   Social History Main Topics  . Smoking status: Never Smoker   . Smokeless tobacco: Not on file  . Alcohol Use: No  . Drug Use: No  . Sexually Active:     Other Topics Concern  . Not on file   Social History Narrative  . No narrative on file    Past Surgical History  Procedure Date  . Mastectomy   . Appendectomy      Physical Exam: Blood pressure 118/83, pulse 139, temperature 98.3 F (36.8 C), temperature source Oral, SpO2 94.00%.  Elderly SF in NAD.  HEENT: [place]/AT. PERRLA, EOMI. No JVD or bruits. No thyromegaly. Lungs with coarse BS CV: RRR, tachy, 2/6 systolic murmur RUSB Abdomen: soft, NT, BS positive. No masses Ext: no cyanosis or edema. Pulses 2+ Skin: warm and dry. Neuro: alert, oriented x 2, CNs intact.    Labs:   Lab Results  Component Value Date   WBC 8.0 08/17/2012   HGB 16.1* 08/17/2012   HCT 49.4* 08/17/2012   MCV 91.1 08/17/2012   PLT 131* 08/17/2012    Lab 08/17/12 1200 08/12/12 1608  NA 140 --  K 4.6 --  CL 102 --  CO2 22 --  BUN 32* --  CREATININE 1.26* --  CALCIUM 10.1 --  PROT -- 7.4  BILITOT -- 0.5  ALKPHOS -- 131*  ALT -- 16  AST -- 34  GLUCOSE 126* --   Lab Results  Component Value Date   TROPONINI <0.30 08/12/2012  Radiology:Clinical Data: Tachycardia.  PORTABLE CHEST - 1 VIEW  Comparison: 08/12/2012  Findings: Cardiomegaly with vascular congestion. Bibasilar  atelectasis. Improving interstitial edema. Small left pleural  effusion.  Surgical clips in the right axilla. No acute bony abnormality.  IMPRESSION:  Improving interstitial edema pattern. Continued bibasilar  atelectasis and cardiomegaly. Small left effusion.  Original Report Authenticated By: Charlett Nose, M.D.  EKG:17-Aug-2012 12:04:17 [facility] ED ROUTINE RECORD JUNCTIONAL TACHYCARDIA ~ absent P waves, rapid V-rate RIGHT AXIS DEVIATION ~ QRS axis ( 91,269) ST DEPRESSION, PROBABLY RATE RELATED ~ ST <-0.37mV & extreme tachycardia PROLONGED QT INTERVAL ~ QTc >546mS  ASSESSMENT AND PLAN:  1. Tachycardia. Etiology is unclear. It appears different than sinus rhythm in October 2013. No P waves seen.  Suggests junctional tachycardia. Lack of response to IV adenosine or metoprolol suggests this is not a reentry issue. Clinically patient is stable. I would recommend admission for telemetry monitoring. Increase oral metoprolol to 50 mg bid. If rate slows would repeat Ecg. At her advanced age conservative management is indicated.  2. CHF secondary to diastolic dysfunction. Appears better than 08/12/12. CXR improved. BNP still elevated but better. Continue current diuretic dose. Given AS persistent tachycardia could contribute to worsening of CHF  3. Moderate to severe Aortic stenosis. Not a candidate for repair.  4. Dementia  5. CKD  Signed: Judi Cong 08/17/2012, 5:44 PM

## 2012-08-17 NOTE — ED Provider Notes (Signed)
History    CSN: [id number] Arrival date & time 08/17/12  1054 First MD Initiated Contact with Patient 08/17/12 1111    Chief Complaint  Patient presents with  . Tachycardia    HPI Pt has history of tachycardia.  She is not sure what the name of her specific condition is.  She has noticed some mild breathing difficulty for the last few days but generally has not had a lot of symptoms.   The staff at the facility she lives at took her heart rate and she was noted to be tachycardic.  She was sent to the ED for further evaluation.  No chest pain.  No difficulty breathing. No fevers.  No vomiting or diarrhea.  Past Medical History  Diagnosis Date  . Aortic stenosis   . Mitral regurgitation   . Tricuspid regurgitation   . Pulmonary hypertension   . LVH (left ventricular hypertrophy)   . Fatigue   . History of dizziness   . Leg cramps   . Breast cancer     Past Surgical History  Procedure Date  . Mastectomy   . Appendectomy     Family History  Problem Relation Age of Onset  . Stroke Mother   . Cancer Father   . Cancer Sister   . Heart disease Brother     History  Substance Use Topics  . Smoking status: Never Smoker   . Smokeless tobacco: Not on file  . Alcohol Use: No    OB History    Grav Para Term Preterm Abortions TAB SAB Ect Mult Living                  Review of Systems  All other systems reviewed and are negative.    Allergies  Codeine; Darvocet; Levofloxacin; Lisinopril; Nitrofurantoin monohyd macro; and Tequin  Home Medications   Current Outpatient Rx  Name  Route  Sig  Dispense  Refill  . ASPIRIN 81 MG PO TBEC   Oral   Take 1 tablet (81 mg total) by mouth daily.   30 tablet   3   . CALCIUM-D 600-400 MG-UNIT PO TABS   Oral   Take 1 tablet by mouth daily.           Marland Kitchen VITAMIN D 1000 UNITS PO TABS   Oral   Take 1,000 Units by mouth daily.         . FUROSEMIDE 20 MG PO TABS   Oral   Take 1 tablet (20 mg total) by mouth daily. May take  an extra dose of lasix for increasing SOB or edema   30 tablet   0   . BOOST PO LIQD   Oral   Take 237 mLs by mouth 2 (two) times daily with a meal.         . MECLIZINE HCL 25 MG PO TABS   Oral   Take 25 mg by mouth 3 (three) times daily as needed. For vertigo         . METOPROLOL TARTRATE 25 MG PO TABS   Oral   Take 25 mg by mouth 2 (two) times daily.          Marland Kitchen MIRTAZAPINE 15 MG PO TABS   Oral   Take 7.5 mg by mouth at bedtime. Take 1/2 tablet at bedtime         . ADULT MULTIVITAMIN W/MINERALS CH   Oral   Take 1 tablet by mouth daily. Centrum          .  ICAPS PO CAPS   Oral   Take 1 capsule by mouth daily.           Marland Kitchen NITROGLYCERIN 0.4 MG SL SUBL   Sublingual   Place 1 tablet (0.4 mg total) under the tongue every 5 (five) minutes as needed for chest pain.   100 tablet   3   . POLYETHYLENE GLYCOL 3350 PO PACK   Oral   Take 17 g by mouth daily as needed. For constipation.          Marland Kitchen POTASSIUM CHLORIDE ER 10 MEQ PO TBCR   Oral   Take 10 mEq by mouth daily.           Marland Kitchen MUPIROCIN 2 % EX OINT   Topical   Apply 1 application topically 2 (two) times daily. Place application into nose 2 times daily. For Bactroban nasal-with cotton tip swab apply pea-sized amount into each Darene Lamer. Gently press sides of nose together to spread ointment. Avoid contact with eyes. Use for 4 days   22 g   0     BP 118/83  Pulse 139  Temp 98.3 F (36.8 C) (Oral)  SpO2 94%  Physical Exam  Nursing note and vitals reviewed. Constitutional: No distress.       Frail, elderly  HENT:  Head: Normocephalic and atraumatic.  Right Ear: External ear normal.  Left Ear: External ear normal.  Eyes: Conjunctivae normal are normal. Right eye exhibits no discharge. Left eye exhibits no discharge. No scleral icterus.  Neck: Neck supple. No tracheal deviation present.  Cardiovascular: Regular rhythm and intact distal pulses.  Tachycardia present.   Murmur heard. Pulmonary/Chest:  Effort normal. No stridor. No respiratory distress. She has no wheezes. She has rales (few rales at bases).       Thoracic kyphosis,   Abdominal: Soft. Bowel sounds are normal. She exhibits no distension. There is no tenderness. There is no rebound and no guarding.  Musculoskeletal: She exhibits no edema and no tenderness.  Neurological: She is alert. She has normal strength. No sensory deficit. Cranial nerve deficit:  no gross defecits noted. She exhibits normal muscle tone. She displays no seizure activity. Coordination normal.  Skin: Skin is warm and dry. No rash noted. She is not diaphoretic.  Psychiatric: She has a normal mood and affect.    ED Course  Procedures (including critical care time) EKG Rate 136 Junctional tachycardia, right axis deviation, ST depression, prolonged QT interval  Labs Reviewed  PRO B NATRIURETIC PEPTIDE - Abnormal; Notable for the following:    Pro B Natriuretic peptide (BNP) 4979.0 (*)     All other components within normal limits  BASIC METABOLIC PANEL - Abnormal; Notable for the following:    Glucose, Bld 126 (*)     BUN 32 (*)     Creatinine, Ser 1.26 (*)     GFR calc non Af Amer 35 (*)     GFR calc Af Amer 41 (*)     All other components within normal limits  CBC - Abnormal; Notable for the following:    RBC 5.42 (*)     Hemoglobin 16.1 (*)     HCT 49.4 (*)     RDW 17.1 (*)     Platelets 131 (*)     All other components within normal limits  PROTIME-INR  POCT I-STAT TROPONIN I   Dg Chest Port 1 View  08/17/2012  *RADIOLOGY REPORT*  Clinical Data: Tachycardia.  PORTABLE CHEST - 1 VIEW  Comparison: 08/12/2012  Findings: Cardiomegaly with vascular congestion.  Bibasilar atelectasis.  Improving interstitial edema. Small left pleural effusion.  Surgical clips in the right axilla.  No acute bony abnormality.  IMPRESSION: Improving interstitial edema pattern.  Continued bibasilar atelectasis and cardiomegaly.  Small left effusion.   Original Report  Authenticated By: Charlett Nose, M.D.        MDM  Pt has remained asymptomatic in the ED.  Her heart rate is persistently elevated , 136.  Very regular on the monitor.  ?SVT or a flutter with a 2:1 block.    Discussed case with [facility].  They will come to see her, here in the ED.  Will continue to monitor at this time considering the fact that she has been in this rhythm since last night and is still asymptomatic.        Celene Kras, MD 08/17/12 [phone]

## 2012-08-17 NOTE — Telephone Encounter (Signed)
Left message to call back  

## 2012-08-17 NOTE — Telephone Encounter (Signed)
[name], pt's healtcare nurse call re pt having problem with heart rate, pls advise

## 2012-08-17 NOTE — ED Notes (Signed)
PER EMS- pt picked up from [facility] with c/o increased heart rate x1 day.  Pt is DNR. Pt reports that she feels fine.

## 2012-08-17 NOTE — Progress Notes (Signed)
Clinical Social Work Department BRIEF PSYCHOSOCIAL ASSESSMENT 08/17/2012  Patient:  [name], [name]     Account Number:  [id number]     Admit date:  08/17/2012  Clinical Social Worker:  Doree Albee  Date/Time:  08/17/2012 04:00 PM  Referred by:  CSW  Date Referred:  08/17/2012 Referred for  SNF Placement   Other Referral:   Interview type:  Patient Other interview type:   and patient daughter, Hewitt Blade    PSYCHOSOCIAL DATA Living Status:  FACILITY Admitted from facility:  [facility] Level of care:  Skilled Nursing Facility Primary support name:  Melburn Popper Primary support relationship to patient:  CHILD, ADULT Degree of support available:   strong, also patient HCPOA.    Per daughter, pt also has son who does not have HCPOA but will be visiting.    CURRENT CONCERNS Current Concerns  Post-Acute Placement   Other Concerns:    SOCIAL WORK ASSESSMENT / PLAN CSW met wtih pt and pt daughter at bedside. CSW introduced self and csw role. Patient was calm and resting in the bed. Pt and Pt daughter shared that patient is a resident at [facility] in skilled nursing. Pt daughter, stated that she is HCPOA. Pt also has a son, who lives in [place] and will be visiting, but does not have HCPOA.    Pt daughter inquired about patient updated insurance. CSW direct pt daughter with registration at the ed check out who confirmed that they can update patient account information.    CSW will complete fl2 for MD signature and pt shadow chart.   Assessment/plan status:  Psychosocial Support/Ongoing Assessment of Needs Other assessment/ plan:   Information/referral to community resources:   none identified at this time    PATIENT'S/FAMILY'S RESPONSE TO PLAN OF CARE: Pt and pt daughter thanked csw for concern and support. Patient plans to return to [facility] when medically stable.      Catha Gosselin, LCSWA  [phone] .08/17/2012 1639pm

## 2012-08-17 NOTE — H&P (Signed)
PCP:   Kimber Relic, MD   Chief Complaint:  Fast HR  HPI: 79 yo pleasant female h/o mod to severe aortic stenosis, diastolic chf, renal insuff, pulm htn was recently d/c from hosp after tx of an acute episode of her chf and tachycardia.  She has been doing very well getting rehab at her snf when today the  Nurses noted that her heart rate was high so she was sent to ED for further evaluation.  Pt did not know her heart rate was in the 140's, no sob or palp.  No cp.  No fevers.  Has not had any problems or pain anywhere.  Cardiology has evaluated the pt and recommended obs overnight with increasing her bblocker.  Her heartrate is down in the 80's now.  She received adenosine and labetolol iv earlier which initially had no affect on her HR.    Review of Systems:  O/w neg  Past Medical History: Past Medical History  Diagnosis Date  . Aortic stenosis   . Mitral regurgitation   . Tricuspid regurgitation   . Pulmonary hypertension   . LVH (left ventricular hypertrophy)   . Fatigue   . History of dizziness   . Leg cramps   . Breast cancer   . Cerebrovascular disease   . Mitral stenosis    Past Surgical History  Procedure Date  . Mastectomy   . Appendectomy     Medications: Prior to Admission medications   Medication Sig Start Date End Date Taking? Authorizing Provider  aspirin EC 81 MG EC tablet Take 1 tablet (81 mg total) by mouth daily. 06/09/12  Yes Ripudeep Jenna Luo, MD  Calcium Carbonate-Vitamin D (CALCIUM-D) 600-400 MG-UNIT TABS Take 1 tablet by mouth daily.     Yes Historical Provider, MD  cholecalciferol (VITAMIN D) 1000 UNITS tablet Take 1,000 Units by mouth daily.   Yes Historical Provider, MD  furosemide (LASIX) 20 MG tablet Take 1 tablet (20 mg total) by mouth daily. May take an extra dose of lasix for increasing SOB or edema 08/14/12  Yes Rodolph Bong, MD  lactose free nutrition (BOOST) LIQD Take 237 mLs by mouth 2 (two) times daily with a meal.   Yes Historical  Provider, MD  meclizine (ANTIVERT) 25 MG tablet Take 25 mg by mouth 3 (three) times daily as needed. For vertigo   Yes Historical Provider, MD  metoprolol tartrate (LOPRESSOR) 25 MG tablet Take 25 mg by mouth 2 (two) times daily.    Yes Historical Provider, MD  mirtazapine (REMERON) 15 MG tablet Take 7.5 mg by mouth at bedtime. Take 1/2 tablet at bedtime   Yes Historical Provider, MD  Multiple Vitamin (MULITIVITAMIN WITH MINERALS) TABS Take 1 tablet by mouth daily. Centrum    Yes Historical Provider, MD  Multiple Vitamins-Minerals (ICAPS) CAPS Take 1 capsule by mouth daily.     Yes Historical Provider, MD  nitroGLYCERIN (NITROQUICK) 0.4 MG SL tablet Place 1 tablet (0.4 mg total) under the tongue every 5 (five) minutes as needed for chest pain. 11/13/10  Yes Cassell Clement, MD  polyethylene glycol (MIRALAX / GLYCOLAX) packet Take 17 g by mouth daily as needed. For constipation.    Yes Historical Provider, MD  potassium chloride (K-DUR) 10 MEQ tablet Take 10 mEq by mouth daily.     Yes Historical Provider, MD  mupirocin ointment (BACTROBAN) 2 % Apply 1 application topically 2 (two) times daily. Place application into nose 2 times daily. For Bactroban nasal-with cotton tip swab  apply pea-sized amount into each Darene Lamer. Gently press sides of nose together to spread ointment. Avoid contact with eyes. Use for 4 days 08/14/12   Rodolph Bong, MD    Allergies:   Allergies  Allergen Reactions  . Codeine Other (See Comments)    Per nursing home [facility]  . Darvocet (Propoxyphene-Acetaminophen) Other (See Comments)    Per nursing home [facility]  . Levofloxacin Other (See Comments)    Unknown reaction  . Lisinopril Other (See Comments)    Unknown reaction  . Nitrofurantoin Monohyd Macro Other (See Comments)    Per nursing home mar  . Tequin Other (See Comments)    Unknown reaction    Social History:  reports that she has never smoked. She does not have any smokeless tobacco history on file. She reports  that she does not drink alcohol or use illicit drugs.  Family History: Family History  Problem Relation Age of Onset  . Stroke Mother   . Cancer Father   . Cancer Sister   . Heart disease Brother     Physical Exam: Filed Vitals:   08/17/12 1115 08/17/12 1630 08/17/12 1800 08/17/12 1844  BP: 118/83 104/64 131/90 117/60  Pulse: 139 139 139 81  Temp: 98.3 F (36.8 C)     TempSrc: Oral     Resp:  24 31 20   SpO2: 94% 96% 95% 98%   General appearance: alert, cooperative and no distress Neck: no JVD and supple, symmetrical, trachea midline Lungs: clear to auscultation bilaterally Heart: regular rate and rhythm  2/6 sem Abdomen: soft, non-tender; bowel sounds normal; no masses,  no organomegaly Extremities: extremities normal, atraumatic, no cyanosis or edema Pulses: 2+ and symmetric Skin: Skin color, texture, turgor normal. No rashes or lesions Neurologic: Grossly normal    Labs on Admission:   Basename 08/17/12 1200  NA 140  K 4.6  CL 102  CO2 22  GLUCOSE 126*  BUN 32*  CREATININE 1.26*  CALCIUM 10.1  MG --  PHOS --    Basename 08/17/12 1200  WBC 8.0  NEUTROABS --  HGB 16.1*  HCT 49.4*  MCV 91.1  PLT 131*    Radiological Exams on Admission:  Dg Chest Port 1 View  08/17/2012  *RADIOLOGY REPORT*  Clinical Data: Tachycardia.  PORTABLE CHEST - 1 VIEW  Comparison: 08/12/2012  Findings: Cardiomegaly with vascular congestion.  Bibasilar atelectasis.  Improving interstitial edema. Small left pleural effusion.  Surgical clips in the right axilla.  No acute bony abnormality.  IMPRESSION: Improving interstitial edema pattern.  Continued bibasilar atelectasis and cardiomegaly.  Small left effusion.   Original Report Authenticated By: Charlett Nose, M.D.     Assessment/Plan 79 yo female with asymptomatic narrow complex tachycardia likely junctional rhythm which is now resolved  Principal Problem:  *Tachycardia Active Problems:  Aortic stenosis with mitral and aortic  insufficiency  History of breast cancer  Diastolic CHF, chronic  Pulmonary hypertension  obs on tele.  Inc bblocker per cardiology recommendations.  HR is currently normalized.  chf is compensated at this time.  DNR.  Jamine Highfill A 08/17/2012, 6:54 PM

## 2012-08-18 ENCOUNTER — Encounter (HOSPITAL_COMMUNITY): Payer: Self-pay | Admitting: Internal Medicine

## 2012-08-18 DIAGNOSIS — I509 Heart failure, unspecified: Secondary | ICD-10-CM

## 2012-08-18 DIAGNOSIS — I5032 Chronic diastolic (congestive) heart failure: Secondary | ICD-10-CM

## 2012-08-18 DIAGNOSIS — I471 Supraventricular tachycardia: Secondary | ICD-10-CM | POA: Diagnosis present

## 2012-08-18 DIAGNOSIS — I5022 Chronic systolic (congestive) heart failure: Secondary | ICD-10-CM

## 2012-08-18 DIAGNOSIS — N189 Chronic kidney disease, unspecified: Secondary | ICD-10-CM

## 2012-08-18 DIAGNOSIS — I498 Other specified cardiac arrhythmias: Secondary | ICD-10-CM

## 2012-08-18 HISTORY — DX: Chronic systolic (congestive) heart failure: I50.22

## 2012-08-18 HISTORY — DX: Chronic kidney disease, unspecified: N18.9

## 2012-08-18 LAB — CBC
HCT: 48.4 % — ABNORMAL HIGH (ref 36.0–46.0)
MCHC: 31.6 g/dL (ref 30.0–36.0)
MCV: 91.8 fL (ref 78.0–100.0)
Platelets: 173 10*3/uL (ref 150–400)
RBC: 5.27 MIL/uL — ABNORMAL HIGH (ref 3.87–5.11)
RDW: 17.1 % — ABNORMAL HIGH (ref 11.5–15.5)
WBC: 7.2 10*3/uL (ref 4.0–10.5)

## 2012-08-18 LAB — BASIC METABOLIC PANEL
CO2: 31 mEq/L (ref 19–32)
GFR calc non Af Amer: 33 mL/min — ABNORMAL LOW (ref >90)
Glucose, Bld: 109 mg/dL — ABNORMAL HIGH (ref 70–99)
Potassium: 5.1 mEq/L (ref 3.5–5.1)
Sodium: 143 mEq/L (ref 135–145)

## 2012-08-18 MED ORDER — LEVALBUTEROL HCL 0.63 MG/3ML IN NEBU
0.6300 mg | INHALATION_SOLUTION | RESPIRATORY_TRACT | Status: DC | PRN
  Administered 2012-08-18: 0.63 mg via RESPIRATORY_TRACT
  Filled 2012-08-18: qty 3

## 2012-08-18 MED ORDER — VITAMINS A & D EX OINT
TOPICAL_OINTMENT | CUTANEOUS | Status: AC
  Administered 2012-08-18: 13:00:00
  Filled 2012-08-18: qty 5

## 2012-08-18 MED ORDER — IPRATROPIUM BROMIDE 0.02 % IN SOLN
0.5000 mg | RESPIRATORY_TRACT | Status: DC | PRN
  Administered 2012-08-18: 0.5 mg via RESPIRATORY_TRACT
  Filled 2012-08-18: qty 2.5

## 2012-08-18 NOTE — Evaluation (Signed)
Physical Therapy Evaluation Patient Details Name: [name] MRN: [id number] DOB: 03/09/1916 Today's Date: 08/18/2012 Time: 5621-3086 PT Time Calculation (min): 40 min  PT Assessment / Plan / Recommendation Clinical Impression  Pt is a 79 yo female with hx of mod to severe aortic stenosis, diastolic CHF, renal insuff, pulm HTN and was recently d/c from hosp after tx of an acute episode of her chf and tachycardia and now admitted from SNF for tachycardia.  Pt requiring more assist on evaluation this admission compared to last.  Pt would benefit from acute PT services in order to improve independence with transfers and ambulation by increasing activity tolerance and improving strength to prepare for d/c back to SNF.      PT Assessment  Patient needs continued PT services    Follow Up Recommendations  SNF (rehab at SNF or [facility] at ALF per [facility])    Does the patient have the potential to tolerate intense rehabilitation      Barriers to Discharge        Equipment Recommendations  None recommended by PT    Recommendations for Other Services     Frequency Min 3X/week    Precautions / Restrictions Precautions Precautions: Fall Precaution Comments: monitor vitals Restrictions Weight Bearing Restrictions: No   Pertinent Vitals/Pain SaO2 on room air at rest 97% SaO2 on room air ambulating 76% SaO2 on 2L O2 [place] ambulating 97% Pt instructed on pursed lip breathing but needs continuous cues. No pain     Mobility  Bed Mobility Bed Mobility: Supine to Sit;Sit to Supine Supine to Sit: 4: Min assist;HOB elevated Sit to Supine: HOB flat;5: Supervision Details for Bed Mobility Assistance: pt required assist for trunk upright and scooting to EOB Transfers Transfers: Stand to Sit;Sit to Stand Sit to Stand: 3: Mod assist;With upper extremity assist;From toilet;From bed Stand to Sit: To bed;To toilet;4: Min assist Details for Transfer Assistance: verbal cues for safe technique, assist to  rise due to weakness and control descent Ambulation/Gait Ambulation/Gait Assistance: 4: Min guard Ambulation Distance (Feet): 160 Feet Assistive device: Rolling walker Ambulation/Gait Assistance Details: initially attempted without oxygen however pulse ox reporting 76% on room air and pt not SOB, so ambulated from doorway back to bed to rest and reapplied 2L oxygen and SaO2 quickly increased back to 98%, pt requested short rest break supine prior to ambulating with oxgyen, pt then ambulated on 2L O2 [place] SaO2 97% and HR 88 upon returning to room Gait Pattern: Step-through pattern Gait velocity: quick pace General Gait Details: pt denies SOB, no unsteady gait observed, pt also assisted to bathroom and required assist to stand and min/guard for balance to perform hygiene    Shoulder Instructions     Exercises     PT Diagnosis: Difficulty walking;Generalized weakness  PT Problem List: Decreased strength;Decreased activity tolerance;Decreased mobility;Cardiopulmonary status limiting activity PT Treatment Interventions: DME instruction;Gait training;Functional mobility training;Therapeutic activities;Therapeutic exercise;Patient/family education   PT Goals Acute Rehab PT Goals PT Goal Formulation: With patient Time For Goal Achievement: 08/25/12 Potential to Achieve Goals: Good Pt will go Supine/Side to Sit: with supervision PT Goal: Supine/Side to Sit - Progress: Goal set today Pt will go Sit to Supine/Side: with supervision PT Goal: Sit to Supine/Side - Progress: Goal set today Pt will go Sit to Stand: with supervision PT Goal: Sit to Stand - Progress: Goal set today Pt will go Stand to Sit: with supervision PT Goal: Stand to Sit - Progress: Goal set today Pt will Ambulate: >150 feet;with  supervision;with least restrictive assistive device;Other (comment) (HR and SaO2 WNL) PT Goal: Ambulate - Progress: Goal set today Pt will Perform Home Exercise Program: with supervision, verbal cues  required/provided PT Goal: Perform Home Exercise Program - Progress: Goal set today  Visit Information  Last PT Received On: 08/18/12 Assistance Needed: +1    Subjective Data  Subjective: Oh good, I'd like that.  (PT)   Prior Functioning  Home Living Type of Home: Assisted living Home Adaptive Equipment: Straight cane;Other (comment) (rollator) Additional Comments: Pt reports she lives at [facility] and occasionally ambulates to meals but can cook (ALF) however after previous admission went to skilled section Prior Function Level of Independence: Independent with assistive device(s) Communication Communication: No difficulties    Cognition  Overall Cognitive Status: Appears within functional limits for tasks assessed/performed Arousal/Alertness: Awake/alert Orientation Level: Appears intact for tasks assessed Behavior During Session: [facility] for tasks performed    Extremity/Trunk Assessment Right Upper Extremity Assessment RUE ROM/Strength/Tone: [facility] for tasks assessed Left Upper Extremity Assessment LUE ROM/Strength/Tone: WFL for tasks assessed Right Lower Extremity Assessment RLE ROM/Strength/Tone: Deficits RLE ROM/Strength/Tone Deficits: functional weakness observed with sit to stand - at least 2+/5 throughout Left Lower Extremity Assessment LLE ROM/Strength/Tone: Deficits LLE ROM/Strength/Tone Deficits: functional weakness observed with sit to stand - at least 2+/5 throughout   Balance    End of Session PT - End of Session Equipment Utilized During Treatment: Gait belt;Oxygen Activity Tolerance: Patient limited by fatigue Patient left: in bed;with call bell/phone within reach;with bed alarm set;with family/visitor present Nurse Communication: Other (comment) (SaO2)  GP Functional Assessment Tool Used: clinical judgement Functional Limitation: Mobility: Walking and moving around Mobility: Walking and Moving Around Current Status ([id number]): At least 40 percent but less  than 60 percent impaired, limited or restricted Mobility: Walking and Moving Around Goal Status [phone]): At least 1 percent but less than 20 percent impaired, limited or restricted   Keeyon Privitera,KATHrine E 08/18/2012, 12:26 PM  Zenovia Jarred, PT, DPT 08/18/2012 Pager: [phone]

## 2012-08-18 NOTE — Progress Notes (Signed)
[facility] PROGRESS NOTE  [name] AVW:[id number] DOB: 1916-01-17 DOA: 08/17/2012 PCP: Kimber Relic, MD  Assessment/Plan: #1 junctional tachycardia Patient currently in normal sinus rhythm on higher doses of metoprolol. Heart rate in the 80s. Repeat EKG pending. Will monitor patient on telemetry through today. If heart rate remains stable could possibly discharge to facility tomorrow. Patient will likely need a event monitor as outpatient. Cardiology is following and appreciate input and recommendations.  #2 moderate to severe aortic stenosis Stable. Patient is not a candidate for repair. Conservative management. Per cardiology.  #3 chronic systolic CHF Stable. Continue current dose of Lasix. Potassium of 5.1 this morning. Will DC scheduled dose of potassium supplement. Per cardiology.  #4 chronic kidney disease Stable.  #5 prophylaxis SCDs for DVT prophylaxis.   Code Status: Full Family Communication: Updated patient at bedside. No family present. Disposition Plan: Back to nursing facility hopefully tomorrow.   Consultants: Cardiology: Dr. [place] 08/17/2012  Procedures:  Chest x-ray 08/17/2012  Antibiotics:  None  HPI/Subjective: Patient denies any chest pain. No shortness of breath.  Objective: Filed Vitals:   08/17/12 1844 08/17/12 2019 08/18/12 0648 08/18/12 0800  BP: 117/60 118/67 143/62 149/81  Pulse: 81 78 70 72  Temp:  97.2 F (36.2 C) 97.4 F (36.3 C) 97.1 F (36.2 C)  TempSrc:  Axillary Oral Axillary  Resp: 20 20 16 20   Height:  5\' 2"  (1.575 m)    Weight:  53.7 kg (118 lb 6.2 oz) 53.5 kg (117 lb 15.1 oz)   SpO2: 98% 97% 97% 98%    Intake/Output Summary (Last 24 hours) at 08/18/12 0913 Last data filed at 08/18/12 0850  Gross per 24 hour  Intake    240 ml  Output    350 ml  Net   -110 ml   Filed Weights   08/17/12 2019 08/18/12 0648  Weight: 53.7 kg (118 lb 6.2 oz) 53.5 kg (117 lb 15.1 oz)    Exam:   General:   NAD  Cardiovascular: RRR. No lower extremity edema.  Respiratory: Minimal expiratory wheezing.  Abdomen: Soft, nontender, nondistended, positive bowel sounds.  Data Reviewed: Basic Metabolic Panel:  Lab 08/18/12 8295 08/17/12 1200 08/14/12 0453 08/13/12 0450 08/12/12 1608  NA 143 140 141 142 136  K 5.1 4.6 4.0 3.8 4.9  CL 104 102 101 99 102  CO2 31 22 33* 31 25  GLUCOSE 109* 126* 101* 99 92  BUN 34* 32* 34* 31* 28*  CREATININE 1.34* 1.26* 1.42* 1.14* 1.03  CALCIUM 10.1 10.1 9.4 9.7 9.9  MG -- -- -- -- --  PHOS -- -- -- -- --   Liver Function Tests:  Lab 08/12/12 1608  AST 34  ALT 16  ALKPHOS 131*  BILITOT 0.5  PROT 7.4  ALBUMIN 3.2*   No results found for this basename: LIPASE:5,AMYLASE:5 in the last 168 hours No results found for this basename: AMMONIA:5 in the last 168 hours CBC:  Lab 08/18/12 0518 08/17/12 1200 08/14/12 0453 08/12/12 1608  WBC 7.2 8.0 9.1 6.8  NEUTROABS -- -- -- 4.7  HGB 15.3* 16.1* 15.3* 16.3*  HCT 48.4* 49.4* 47.9* 50.5*  MCV 91.8 91.1 91.8 90.2  PLT 173 131* 132* 138*   Cardiac Enzymes:  Lab 08/12/12 1608  CKTOTAL --  CKMB --  CKMBINDEX --  TROPONINI <0.30   BNP (last 3 results)  Basename 08/17/12 1200 08/12/12 1609  PROBNP 4979.0* 6337.0*   CBG: No results found for this basename: GLUCAP:5 in the last  168 hours  Recent Results (from the past 240 hour(s))  MRSA PCR SCREENING     Status: Abnormal   Collection Time   08/13/12 12:46 AM      Component Value Range Status Comment   MRSA by PCR POSITIVE (*) NEGATIVE Final   MRSA PCR SCREENING     Status: Abnormal   Collection Time   08/17/12  9:29 PM      Component Value Range Status Comment   MRSA by PCR POSITIVE (*) NEGATIVE Final      Studies: Dg Chest Port 1 View  08/17/2012  *RADIOLOGY REPORT*  Clinical Data: Tachycardia.  PORTABLE CHEST - 1 VIEW  Comparison: 08/12/2012  Findings: Cardiomegaly with vascular congestion.  Bibasilar atelectasis.  Improving interstitial edema.  Small left pleural effusion.  Surgical clips in the right axilla.  No acute bony abnormality.  IMPRESSION: Improving interstitial edema pattern.  Continued bibasilar atelectasis and cardiomegaly.  Small left effusion.   Original Report Authenticated By: Charlett Nose, M.D.     Scheduled Meds:   . aspirin EC  81 mg Oral Daily  . Chlorhexidine Gluconate Cloth  6 each Topical Q0600  . cholecalciferol  1,000 Units Oral Daily  . furosemide  20 mg Oral Daily  . lactose free nutrition  237 mL Oral BID WC  . metoprolol tartrate  50 mg Oral BID  . mirtazapine  7.5 mg Oral QHS  . mupirocin ointment  1 application Topical BID  . sodium chloride  3 mL Intravenous Q12H   Continuous Infusions:   Principal Problem:  *AV junctional tachycardia Active Problems:  Aortic stenosis with mitral and aortic insufficiency  History of breast cancer  Tachycardia  Diastolic CHF, chronic  Pulmonary hypertension    Time spent: > 35 mins    [facility]  [facility] Pager [phone]. If 8PM-8AM, please contact night-coverage at [email or web address], password [facility] 08/18/2012, 9:13 AM  LOS: 1 day

## 2012-08-18 NOTE — Progress Notes (Addendum)
TELEMETRY: Reviewed telemetry pt in NSR: Filed Vitals:   08/17/12 1844 08/17/12 2019 08/18/12 0648 08/18/12 0800  BP: 117/60 118/67 143/62   Pulse: 81 78 70 72  Temp:  97.2 F (36.2 C) 97.4 F (36.3 C)   TempSrc:  Axillary Oral   Resp: 20 20 16    Height:  5\' 2"  (1.575 m)    Weight:  118 lb 6.2 oz (53.7 kg) 117 lb 15.1 oz (53.5 kg)   SpO2: 98% 97% 97% 98%    Intake/Output Summary (Last 24 hours) at 08/18/12 0830 Last data filed at 08/18/12 0300  Gross per 24 hour  Intake      0 ml  Output    350 ml  Net   -350 ml    SUBJECTIVE No complaints today. Denies SOB.  LABS: Basic Metabolic Panel:  Basename 08/18/12 0518 08/17/12 1200  NA 143 140  K 5.1 4.6  CL 104 102  CO2 31 22  GLUCOSE 109* 126*  BUN 34* 32*  CREATININE 1.34* 1.26*  CALCIUM 10.1 10.1  MG -- --  PHOS -- --   CBC:  Basename 08/18/12 0518 08/17/12 1200  WBC 7.2 8.0  NEUTROABS -- --  HGB 15.3* 16.1*  HCT 48.4* 49.4*  MCV 91.8 91.1  PLT 173 131*    Radiology/Studies:  Dg Chest 2 View  08/12/2012  *RADIOLOGY REPORT*  Clinical Data: Tachycardia and shortness of breath.  CHEST - 2 VIEW  Comparison: Two-view chest 07/01/2011.  Findings: The heart is enlarged.  Mild interstitial edema is new. Mild bibasilar atelectasis is evident.  Small bilateral pleural effusions are evident.  Exaggerated thoracic kyphosis is stable. Postsurgical changes are again noted in the right axilla.  IMPRESSION:  1.  Progressive congestive heart failure. 2.  Increasing bibasilar airspace disease likely reflects atelectasis. 3.  Postsurgical changes of the right axilla.   Original Report Authenticated By: Marin Roberts, M.D.    Dg Chest Port 1 View  08/17/2012  *RADIOLOGY REPORT*  Clinical Data: Tachycardia.  PORTABLE CHEST - 1 VIEW  Comparison: 08/12/2012  Findings: Cardiomegaly with vascular congestion.  Bibasilar atelectasis.  Improving interstitial edema. Small left pleural effusion.  Surgical clips in the right axilla.   No acute bony abnormality.  IMPRESSION: Improving interstitial edema pattern.  Continued bibasilar atelectasis and cardiomegaly.  Small left effusion.   Original Report Authenticated By: Charlett Nose, M.D.     PHYSICAL EXAM General: Elderly, in no acute distress. Head: Normocephalic, atraumatic, sclera non-icteric, no xanthomas, nares are without discharge. Neck: Negative for carotid bruits. JVD not elevated. Lungs: Clear bilaterally to auscultation without wheezes, rales, or rhonchi. Breathing is unlabored. Heart: RRR S1 S2 with 2/6 systolic murmur RUSB Abdomen: Soft, non-tender, non-distended with normoactive bowel sounds. No hepatomegaly. No rebound/guarding. No obvious abdominal masses. Msk:  Strength and tone appears normal for age. Extremities: No clubbing, cyanosis or edema.  Distal pedal pulses are 2+ and equal bilaterally. Neuro: Alert and oriented X 3. Moves all extremities spontaneously. Psych:  Responds to questions appropriately with a normal affect.  ASSESSMENT AND PLAN: 1. Junctional tachycardia. Now back in NSR. On higher dose of metoprolol. Will monitor today and if stable plan DC to nursing facility tomorrow. I will review Ecgs with EP. May want to arrange event monitor after she goes home to see how much break through she is having since she is not symptomatic. 2. Moderate to severe AS 3. Chronic systolic CHF. 4. CKD 5. Dementia.  Principal Problem:  *Tachycardia Active  Problems:  Aortic stenosis with mitral and aortic insufficiency  History of breast cancer  Diastolic CHF, chronic  Pulmonary hypertension    Signed, Rosalia Mcavoy [place] MD,FACC 08/18/2012 8:33 AM  I reviewed Ecg tracings with EP from 08/12/12 and 08/17/12. They feel that this is an atrial tachycardia. This would explain lack of response to adenosine. Will see how she responds to increased beta blocker. If she has significant recurrence could try a CCB.  Elenna Spratling [place] MD, [facility]

## 2012-08-19 DIAGNOSIS — D696 Thrombocytopenia, unspecified: Secondary | ICD-10-CM | POA: Diagnosis present

## 2012-08-19 DIAGNOSIS — N189 Chronic kidney disease, unspecified: Secondary | ICD-10-CM

## 2012-08-19 LAB — BASIC METABOLIC PANEL
BUN: 35 mg/dL — ABNORMAL HIGH (ref 6–23)
CO2: 33 mEq/L — ABNORMAL HIGH (ref 19–32)
Chloride: 103 mEq/L (ref 96–112)
GFR calc Af Amer: 43 mL/min — ABNORMAL LOW (ref >90)
Potassium: 4.2 mEq/L (ref 3.5–5.1)

## 2012-08-19 LAB — CBC
HCT: 48.8 % — ABNORMAL HIGH (ref 36.0–46.0)
Hemoglobin: 15.3 g/dL — ABNORMAL HIGH (ref 12.0–15.0)
MCHC: 31.4 g/dL (ref 30.0–36.0)
MCV: 92.4 fL (ref 78.0–100.0)

## 2012-08-19 MED ORDER — METOPROLOL TARTRATE 25 MG PO TABS: 50.0000 mg | ORAL_TABLET | Freq: Two times a day (BID) | ORAL | Status: AC

## 2012-08-19 NOTE — Progress Notes (Signed)
CSW met with patient and patient's daughter. Requested palliative consult. CSW text page Dr. Darnelle Catalan with this information.  Aydeen Blume C. Josaiah Muhammed MSW, LCSW [phone]

## 2012-08-19 NOTE — Progress Notes (Signed)
[facility] PROGRESS NOTE  [name] ZOX:[id number] DOB: 06/12/1916 DOA: 08/17/2012 PCP: Kimber Relic, MD  Brief narrative: [name] is a 79 year old female with a past medical history of moderate to severe aortic stenosis, chronic diastolic congestive heart failure (EF 50%, grade 2 diastolic dysfunction on 2D echo done 08/13/2012), chronic renal insufficiency, and pulmonary hypertension who was recently in the hospital 08/12/2012-08/14/2012 for treatment of tachycardia and heart failure. She was readmitted 08/17/2012 with symptomatic tachycardia. She was treated with adenosine and labetalol in the emergency department and has been evaluated by her cardiologist, who increased the dose of her beta blocker. Her family has requested palliative care input and a medical order for scope of treatment document (MOST) which can be done in the outpatient setting.  Assessment/Plan: Principal Problem:  *Paroxysmal Junctional tachycardia versus atrial tachycardia  Cardiology following. Dr. [place]iland reviewed EKG tracings with EP from 08/12/2012 and 08/17/2012. They felt that her rhythm was most likely an atrial tachycardia which would explain her lack of response to adenosine. Accordingly, her beta blocker was increased with recommendations to consider a calcium channel blocker for significant recurrence.  Heart rate now controlled and maintaining normal sinus rhythm on metoprolol at a higher dose. Monitor on telemetry throughout her hospital stay.  Followup with cardiology and consider an event monitor as an outpatient. Active Problems:  Thrombocytopenia  Mild. Monitor for signs of bleeding.  Aortic stenosis with mitral and aortic insufficiency  Stable. Not a candidate for operative repair. Continue conservative management.  Stable.  Systolic and Diastolic CHF, chronic  Continue medical management with blood pressure control. Continue Lasix.  Pulmonary hypertension  Stable.  Stage III CKD (chronic  kidney disease)  Creatinine stable.    Code Status: DNR Family Communication: Daughter, [name], by telephone [phone]) Disposition Plan: Back to her ALF 08/20/12.   Medical Consultants:  Dr. Peter [place], Cardiology.  Other Consultants:  Physical therapy: Home health PT at ALF  Anti-infectives:  None.  HPI/Subjective: [name] denies any chest pain, dyspnea, or palpitations. She feels well enough to be discharged, but has requested that we wait until tomorrow since it is late in the day.  Objective: Filed Vitals:   08/18/12 1400 08/18/12 2116 08/19/12 0940 08/19/12 1303  BP: 130/63 139/68 108/51 103/67  Pulse: 72 70 65 61  Temp: 97.1 F (36.2 C) 97.4 F (36.3 C)  97.6 F (36.4 C)  TempSrc: Axillary Oral  Oral  Resp: 18 18  18   Height:      Weight:      SpO2: 98% 99%  97%    Intake/Output Summary (Last 24 hours) at 08/19/12 1450 Last data filed at 08/19/12 0900  Gross per 24 hour  Intake    600 ml  Output    600 ml  Net      0 ml    Exam: Gen:  NAD Cardiovascular:  RRR, No M/R/G Respiratory:  Lungs CTAB Gastrointestinal:  Abdomen soft, NT/ND, + BS Extremities:  No C/E/C  Data Reviewed: Basic Metabolic Panel:  Lab 08/19/12 8295 08/18/12 0518 08/17/12 1200 08/14/12 0453 08/13/12 0450  NA 142 143 140 141 142  K 4.2 5.1 -- -- --  CL 103 104 102 101 99  CO2 33* 31 22 33* 31  GLUCOSE 99 109* 126* 101* 99  BUN 35* 34* 32* 34* 31*  CREATININE 1.21* 1.34* 1.26* 1.42* 1.14*  CALCIUM 9.5 10.1 10.1 9.4 9.7  MG -- -- -- -- --  PHOS -- -- -- -- --  GFR Estimated Creatinine Clearance: 22.5 ml/min (by C-G formula based on Cr of 1.21). Liver Function Tests:  Lab 08/12/12 1608  AST 34  ALT 16  ALKPHOS 131*  BILITOT 0.5  PROT 7.4  ALBUMIN 3.2*   Coagulation profile  Lab 08/17/12 1200  INR 0.96  PROTIME --    CBC:  Lab 08/19/12 0555 08/18/12 0518 08/17/12 1200 08/14/12 0453 08/12/12 1608  WBC 6.9 7.2 8.0 9.1 6.8  NEUTROABS -- -- -- -- 4.7    HGB 15.3* 15.3* 16.1* 15.3* 16.3*  HCT 48.8* 48.4* 49.4* 47.9* 50.5*  MCV 92.4 91.8 91.1 91.8 90.2  PLT 120* 173 131* 132* 138*   Cardiac Enzymes:  Lab 08/12/12 1608  CKTOTAL --  CKMB --  CKMBINDEX --  TROPONINI <0.30   BNP (last 3 results)  Basename 08/17/12 1200 08/12/12 1609  PROBNP 4979.0* 6337.0*   Microbiology Recent Results (from the past 240 hour(s))  MRSA PCR SCREENING     Status: Abnormal   Collection Time   08/13/12 12:46 AM      Component Value Range Status Comment   MRSA by PCR POSITIVE (*) NEGATIVE Final   MRSA PCR SCREENING     Status: Abnormal   Collection Time   08/17/12  9:29 PM      Component Value Range Status Comment   MRSA by PCR POSITIVE (*) NEGATIVE Final      Procedures and Diagnostic Studies: Dg Chest 2 View  08/12/2012  *RADIOLOGY REPORT*  Clinical Data: Tachycardia and shortness of breath.  CHEST - 2 VIEW  Comparison: Two-view chest 07/01/2011.  Findings: The heart is enlarged.  Mild interstitial edema is new. Mild bibasilar atelectasis is evident.  Small bilateral pleural effusions are evident.  Exaggerated thoracic kyphosis is stable. Postsurgical changes are again noted in the right axilla.  IMPRESSION:  1.  Progressive congestive heart failure. 2.  Increasing bibasilar airspace disease likely reflects atelectasis. 3.  Postsurgical changes of the right axilla.   Original Report Authenticated By: Marin Roberts, M.D.    Dg Chest Port 1 View  08/17/2012  *RADIOLOGY REPORT*  Clinical Data: Tachycardia.  PORTABLE CHEST - 1 VIEW  Comparison: 08/12/2012  Findings: Cardiomegaly with vascular congestion.  Bibasilar atelectasis.  Improving interstitial edema. Small left pleural effusion.  Surgical clips in the right axilla.  No acute bony abnormality.  IMPRESSION: Improving interstitial edema pattern.  Continued bibasilar atelectasis and cardiomegaly.  Small left effusion.   Original Report Authenticated By: Charlett Nose, M.D.     Scheduled Meds:    . aspirin EC  81 mg Oral Daily  . Chlorhexidine Gluconate Cloth  6 each Topical Q0600  . cholecalciferol  1,000 Units Oral Daily  . furosemide  20 mg Oral Daily  . lactose free nutrition  237 mL Oral BID WC  . metoprolol tartrate  50 mg Oral BID  . mirtazapine  7.5 mg Oral QHS  . mupirocin ointment  1 application Topical BID  . sodium chloride  3 mL Intravenous Q12H   Continuous Infusions:   Time spent: 35 minutes.   LOS: 2 days   Mooney,Alexa  [facility] Pager [phone].  If 8PM-8AM, please contact night-coverage at [email or web address], password [facility] 08/19/2012, 2:50 PM

## 2012-08-19 NOTE — Progress Notes (Signed)
TELEMETRY: Reviewed telemetry pt in NSR: Filed Vitals:   08/18/12 1034 08/18/12 1130 08/18/12 1400 08/18/12 2116  BP: 121/57 117/63 130/63 139/68  Pulse: 68 68 72 70  Temp:   97.1 F (36.2 C) 97.4 F (36.3 C)  TempSrc:  Axillary Axillary Oral  Resp:  16 18 18   Height:      Weight:      SpO2:   98% 99%    Intake/Output Summary (Last 24 hours) at 08/19/12 0743 Last data filed at 08/19/12 0715  Gross per 24 hour  Intake    840 ml  Output    720 ml  Net    120 ml    SUBJECTIVE No complaints today. Denies SOB.  LABS: Basic Metabolic Panel:  Basename 08/19/12 0555 08/18/12 0518  NA 142 143  K 4.2 5.1  CL 103 104  CO2 33* 31  GLUCOSE 99 109*  BUN 35* 34*  CREATININE 1.21* 1.34*  CALCIUM 9.5 10.1  MG -- --  PHOS -- --   CBC:  Basename 08/19/12 0555 08/18/12 0518  WBC 6.9 7.2  NEUTROABS -- --  HGB 15.3* 15.3*  HCT 48.8* 48.4*  MCV 92.4 91.8  PLT 120* 173    Radiology/Studies:  Dg Chest 2 View  08/12/2012  *RADIOLOGY REPORT*  Clinical Data: Tachycardia and shortness of breath.  CHEST - 2 VIEW  Comparison: Two-view chest 07/01/2011.  Findings: The heart is enlarged.  Mild interstitial edema is new. Mild bibasilar atelectasis is evident.  Small bilateral pleural effusions are evident.  Exaggerated thoracic kyphosis is stable. Postsurgical changes are again noted in the right axilla.  IMPRESSION:  1.  Progressive congestive heart failure. 2.  Increasing bibasilar airspace disease likely reflects atelectasis. 3.  Postsurgical changes of the right axilla.   Original Report Authenticated By: Marin Roberts, M.D.    Dg Chest Port 1 View  08/17/2012  *RADIOLOGY REPORT*  Clinical Data: Tachycardia.  PORTABLE CHEST - 1 VIEW  Comparison: 08/12/2012  Findings: Cardiomegaly with vascular congestion.  Bibasilar atelectasis.  Improving interstitial edema. Small left pleural effusion.  Surgical clips in the right axilla.  No acute bony abnormality.  IMPRESSION: Improving  interstitial edema pattern.  Continued bibasilar atelectasis and cardiomegaly.  Small left effusion.   Original Report Authenticated By: Charlett Nose, M.D.     PHYSICAL EXAM General: Elderly, in no acute distress. Head: Normocephalic, atraumatic, sclera non-icteric, no xanthomas, nares are without discharge. Neck: Negative for carotid bruits. JVD not elevated. Lungs: Clear bilaterally to auscultation without wheezes, rales, or rhonchi. Breathing is unlabored. Heart: RRR S1 S2 with 2/6 systolic murmur RUSB Abdomen: Soft, non-tender, non-distended with normoactive bowel sounds. No hepatomegaly. No rebound/guarding. No obvious abdominal masses. Msk:  Strength and tone appears normal for age. Extremities: No clubbing, cyanosis or edema.  Distal pedal pulses are 2+ and equal bilaterally. Neuro: Alert and oriented X 3. Moves all extremities spontaneously. Psych:  Responds to questions appropriately with a normal affect.  ASSESSMENT AND PLAN: 1. Atrial tachycardia. Now back in NSR. On higher dose of metoprolol. OK for discharge to NH today. If recurrent tachycardia consider CCB. Will arrange follow up in our office in 2 weeks. 2. Moderate to severe AS 3. Chronic systolic CHF. 4. CKD 5. Dementia.  Principal Problem:  *AV junctional tachycardia Active Problems:  Aortic stenosis with mitral and aortic insufficiency  History of breast cancer  Tachycardia  Diastolic CHF, chronic  Pulmonary hypertension  CKD (chronic kidney disease)  Systolic CHF, chronic  Signed, Peter Swaziland MD,FACC 08/19/2012 7:43 AM

## 2012-08-19 NOTE — Discharge Summary (Addendum)
Physician Discharge Summary  [name] ZOX:[id number] DOB: [birth year] DOA: 08/17/2012  PCP: Kimber Relic, MD  Admit date: 08/17/2012 Discharge date: 08/20/2012  Recommendations for Outpatient Follow-up:  1. Family requests an outpatient palliative care consultation for documentation of a medical order for scope of treatment form (MOST). 2. Followup with cardiology for consideration of outpatient of that monitor.  Discharge Diagnoses:  Principal Problem:  *Paroxysmal junctional tachycardia versus atrial tachycardia Active Problems:   Aortic stenosis with mitral and aortic insufficiency   History of breast cancer   Tachycardia   Chronic combined systolic and diastolic CHF, NYHA class 2   Pulmonary hypertension   Stage III chronic kidney disease   Thrombocytopenia   Discharge Condition: Improved, stable.  Diet recommendation: Low-sodium, heart healthy.  History of present illness:  [name] is a 79 year old female with a past medical history of moderate to severe aortic stenosis, chronic diastolic congestive heart failure (EF 50%, grade 2 diastolic dysfunction on 2D echo done 08/13/2012), chronic renal insufficiency, and pulmonary hypertension who was recently in the hospital 08/12/2012-08/14/2012 for treatment of tachycardia and heart failure. She was readmitted 08/17/2012 with symptomatic tachycardia. She was treated with adenosine and labetalol in the emergency department and has been evaluated by her cardiologist, who increased the dose of her beta blocker. Her family has requested palliative care input and a medical order for scope of treatment document (MOST) which can be done in the outpatient setting.  Hospital Course by problem:  Principal Problem:  *Paroxysmal Junctional tachycardia versus atrial tachycardia  Cardiology following. Dr. Swaziland reviewed EKG tracings with EP from 08/12/2012 and 08/17/2012. They felt that her rhythm was most likely an atrial tachycardia which  would explain her lack of response to adenosine. Accordingly, her beta blocker was increased with recommendations to consider a calcium channel blocker for significant recurrence.  Heart rate now controlled and maintaining normal sinus rhythm on metoprolol at a higher dose. Monitor on telemetry throughout her hospital stay.  Followup with cardiology and consider an event monitor as an outpatient. Active Problems:  Thrombocytopenia  Mild. Monitor for signs of bleeding. Aortic stenosis with mitral and aortic insufficiency  Stable. Not a candidate for operative repair. Continue conservative management. Stable. Systolic and Diastolic CHF, chronic  Continue medical management with blood pressure control. Continue Lasix. Pulmonary hypertension  Stable. Stage III CKD (chronic kidney disease)  Creatinine stable.  Procedures:  None.  Consultations:  Dr. Peter Swaziland, Cardiology  Discharge Exam: Filed Vitals:   08/19/12 1303  BP: 103/67  Pulse: 61  Temp: 97.6 F (36.4 C)  Resp: 18   Filed Vitals:   08/18/12 1400 08/18/12 2116 08/19/12 0940 08/19/12 1303  BP: 130/63 139/68 108/51 103/67  Pulse: 72 70 65 61  Temp: 97.1 F (36.2 C) 97.4 F (36.3 C)  97.6 F (36.4 C)  TempSrc: Axillary Oral  Oral  Resp: 18 18  18   Height:      Weight:      SpO2: 98% 99%  97%    Gen:  NAD Cardiovascular:  RRR, No M/R/G Respiratory: Lungs CTAB Gastrointestinal: Abdomen soft, NT/ND with normal active bowel sounds. Extremities: No C/E/C   Discharge Instructions      Discharge Orders    Future Appointments: Provider: Department: Dept Phone: Center:   09/04/2012 10:00 AM Cassell Clement, MD [facility] Main Office [place]) [phone] [facility]     Future Orders Please Complete By Expires   Diet - low sodium heart healthy      Increase  activity slowly      Walk with assistance      Walker       Call MD for:  difficulty breathing, headache or visual disturbances      Call MD  for:  persistant dizziness or light-headedness          Medication List     As of 08/19/2012  3:08 PM    TAKE these medications         aspirin 81 MG EC tablet   Take 1 tablet (81 mg total) by mouth daily.      Calcium-D 600-400 MG-UNIT Tabs   Take 1 tablet by mouth daily.      cholecalciferol 1000 UNITS tablet   Commonly known as: VITAMIN D   Take 1,000 Units by mouth daily.      furosemide 20 MG tablet   Commonly known as: LASIX   Take 1 tablet (20 mg total) by mouth daily. May take an extra dose of lasix for increasing SOB or edema      ICAPS Caps   Take 1 capsule by mouth daily.      lactose free nutrition Liqd   Take 237 mLs by mouth 2 (two) times daily with a meal.      meclizine 25 MG tablet   Commonly known as: ANTIVERT   Take 25 mg by mouth 3 (three) times daily as needed. For vertigo      metoprolol tartrate 25 MG tablet   Commonly known as: LOPRESSOR   Take 2 tablets (50 mg total) by mouth 2 (two) times daily.      mirtazapine 15 MG tablet   Commonly known as: REMERON   Take 7.5 mg by mouth at bedtime. Take 1/2 tablet at bedtime      multivitamin with minerals Tabs   Take 1 tablet by mouth daily. Centrum      mupirocin ointment 2 %   Commonly known as: BACTROBAN   Apply 1 application topically 2 (two) times daily. Place application into nose 2 times daily. For Bactroban nasal-with cotton tip swab apply pea-sized amount into each Darene Lamer. Gently press sides of nose together to spread ointment. Avoid contact with eyes.  Use for 4 days      nitroGLYCERIN 0.4 MG SL tablet   Commonly known as: NITROSTAT   Place 1 tablet (0.4 mg total) under the tongue every 5 (five) minutes as needed for chest pain.      polyethylene glycol packet   Commonly known as: MIRALAX / GLYCOLAX   Take 17 g by mouth daily as needed. For constipation.      potassium chloride 10 MEQ tablet   Commonly known as: K-DUR   Take 10 mEq by mouth daily.        Follow-up Information      Follow up with GREEN, Lenon Curt, MD. Schedule an appointment as soon as possible for a visit in 1 week.   Contact information:   [address] Jeanella Anton [place] [address] [phone]       Follow up with Cassell Clement, MD. Schedule an appointment as soon as possible for a visit in 1 week.   Contact information:   [address] [place] [address] [phone]           The results of significant diagnostics from this hospitalization (including imaging, microbiology, ancillary and laboratory) are listed below for reference.    Significant Diagnostic Studies: Dg Chest 2 View  08/12/2012  *RADIOLOGY REPORT*  Clinical Data: Tachycardia and shortness of breath.  CHEST - 2 VIEW  Comparison: Two-view chest 07/01/2011.  Findings: The heart is enlarged.  Mild interstitial edema is new. Mild bibasilar atelectasis is evident.  Small bilateral pleural effusions are evident.  Exaggerated thoracic kyphosis is stable. Postsurgical changes are again noted in the right axilla.  IMPRESSION:  1.  Progressive congestive heart failure. 2.  Increasing bibasilar airspace disease likely reflects atelectasis. 3.  Postsurgical changes of the right axilla.   Original Report Authenticated By: Marin Roberts, M.D.    Dg Chest Port 1 View  08/17/2012  *RADIOLOGY REPORT*  Clinical Data: Tachycardia.  PORTABLE CHEST - 1 VIEW  Comparison: 08/12/2012  Findings: Cardiomegaly with vascular congestion.  Bibasilar atelectasis.  Improving interstitial edema. Small left pleural effusion.  Surgical clips in the right axilla.  No acute bony abnormality.  IMPRESSION: Improving interstitial edema pattern.  Continued bibasilar atelectasis and cardiomegaly.  Small left effusion.   Original Report Authenticated By: Charlett Nose, M.D.     Microbiology: Recent Results (from the past 240 hour(s))  MRSA PCR SCREENING     Status: Abnormal   Collection Time   08/13/12 12:46 AM      Component Value Range Status  Comment   MRSA by PCR POSITIVE (*) NEGATIVE Final   MRSA PCR SCREENING     Status: Abnormal   Collection Time   08/17/12  9:29 PM      Component Value Range Status Comment   MRSA by PCR POSITIVE (*) NEGATIVE Final      Labs: Basic Metabolic Panel:  Lab 08/19/12 9604 08/18/12 0518 08/17/12 1200 08/14/12 0453 08/13/12 0450  NA 142 143 140 141 142  K 4.2 5.1 4.6 4.0 3.8  CL 103 104 102 101 99  CO2 33* 31 22 33* 31  GLUCOSE 99 109* 126* 101* 99  BUN 35* 34* 32* 34* 31*  CREATININE 1.21* 1.34* 1.26* 1.42* 1.14*  CALCIUM 9.5 10.1 10.1 9.4 9.7  MG -- -- -- -- --  PHOS -- -- -- -- --   Liver Function Tests:  Lab 08/12/12 1608  AST 34  ALT 16  ALKPHOS 131*  BILITOT 0.5  PROT 7.4  ALBUMIN 3.2*   CBC:  Lab 08/19/12 0555 08/18/12 0518 08/17/12 1200 08/14/12 0453 08/12/12 1608  WBC 6.9 7.2 8.0 9.1 6.8  NEUTROABS -- -- -- -- 4.7  HGB 15.3* 15.3* 16.1* 15.3* 16.3*  HCT 48.8* 48.4* 49.4* 47.9* 50.5*  MCV 92.4 91.8 91.1 91.8 90.2  PLT 120* 173 131* 132* 138*   Cardiac Enzymes:  Lab 08/12/12 1608  CKTOTAL --  CKMB --  CKMBINDEX --  TROPONINI <0.30   BNP (last 3 results)  Basename 08/17/12 1200 08/12/12 1609  PROBNP 4979.0* 6337.0*    Time coordinating discharge: 35 minutes.  Signed:  Cambrey Lupi  Pager [phone] [facility] 08/19/2012, 3:08 PM

## 2012-08-19 NOTE — Progress Notes (Signed)
Patient ID:[name]      DOB: 29-Nov-1915      ZOX:[id number]  Consult for Palliative Care consult received.  Discussed with Dr. Darnelle Catalan.  If patient is stable for discharge today arrangements are available to for Palliative Care to see at SNF.  Dr. Darnelle Catalan to explore this with the family and get back with [place].  Jesiah Grismer L. Ladona Ridgel, MD MBA The Palliative Medicine Team at [facility] Phone: [phone] Pager: [phone]

## 2012-08-19 NOTE — Clinical Documentation Improvement (Signed)
CKD DOCUMENTATION CLARIFICATION QUERY   THIS DOCUMENT IS NOT A PERMANENT PART OF THE MEDICAL RECORD  TO RESPOND TO THE THIS QUERY, FOLLOW THE INSTRUCTIONS BELOW:  1. If needed, update documentation for the patient's encounter via the notes activity.  2. Access this query again and click edit on the In [employer].  3. After updating, or not, click F2 to complete all highlighted (required) fields concerning your review. Select "additional documentation in the medical record" OR "no additional documentation provided".  4. Click Sign note button.  5. The deficiency will fall out of your In Basket *Please let us know if you are not able to complete this workflow by phone or e-mail (listed below).  Please update your documentation within the medical record to reflect your response to this query.                                                                                        08/19/12   Dear Dr. Janee Morn, D:/Associates,  In a better effort to capture your patient's severity of illness, reflect appropriate length of stay and utilization of resources, a review of the patient medical record has revealed the following indicators.    Based on your clinical judgment, please clarify and document in a progress note and/or discharge summary the clinical condition associated with the following supporting information:  In responding to this query please exercise your independent judgment.  The fact that a query is asked, does not imply that any particular answer is desired or expected.   In this pt admitted with tachycardia a review of the medical record reveals the following:   CKD documented on 08/18/12  Abnormal BUN/CR  Abnormal GFR   Treatment: Monitoring  Clarification Needed   Please clarify the stage of CKD for this admission and document in pn and d/c summary.    Possible Clinical Conditions?   _______CKD Stage I -  GFR > OR = 90 _______CKD Stage II - GFR  60-80 _______CKD Stage III - GFR 30-59 _______CKD Stage IV - GFR 15-29 _______CKD Stage V - GFR < 15 _______ESRD (End Stage Renal Disease) _______Other condition_____________ _______Cannot Clinically determine   Supporting Information:  Risk Factors: CKD. HTN,  Aortic stenosis with mitral and aortic insufficiency, Diastolic CHF, chronic, Pulmonary HTN,  Tachycardia  Signs & Symptoms:    Diagnostics:  Lab:  Component     Latest Ref Rng 08/17/2012 08/18/2012 08/19/2012  BUN     6 - 23 mg/dL 32 (H) 34 (H) 35 (H)  Creatinine     0.50 - 1.10 mg/dL 1.61 (H) 0.96 (H) 0.45 (H)   Component     Latest Ref Rng 08/17/2012 08/18/2012 08/19/2012  GFR calc non Af Amer     >90 mL/min 35 (L) 33 (L) 37 (L)    Treatment: IVF NS   You may use possible, probable, or suspect with inpatient documentation. possible, probable, suspected diagnoses MUST be documented at the time of discharge  Reviewed: additional documentation in the medical record ljh   Thank You,  Enis Slipper  RN, BSN, MSN/Inf, CCDS Clinical Documentation Specialist [facility]  Dept Pager: [phone] / E-mail: [email or web address]  Health Information Management [place]

## 2012-08-20 NOTE — Care Management Note (Addendum)
    Page 1 of 1   08/20/2012     1:50:47 PM   CARE MANAGEMENT NOTE 08/20/2012  Patient:  SHYONNA, CARLIN   Account Number:  [id number]  Date Initiated:  08/20/2012  Documentation initiated by:  Lanier Clam  Subjective/Objective Assessment:   ADMITTED W/[email or web address].READMIT.     Action/Plan:   FROM SNF.   Anticipated DC Date:  08/20/2012   Anticipated DC Plan:  SKILLED NURSING FACILITY      DC Planning Services  CM consult      Choice offered to / List presented to:             Status of service:  Completed, signed off Medicare Important Message given?   (If response is "NO", the following Medicare IM given date fields will be blank) Date Medicare IM given:   Date Additional Medicare IM given:    Discharge Disposition:  SKILLED NURSING FACILITY  Per UR Regulation:  Reviewed for med. necessity/level of care/duration of stay  If discussed at Long Length of Stay Meetings, dates discussed:    Comments:  08/20/12 [facility] RN,BSN NCM [phone]

## 2012-08-20 NOTE — Progress Notes (Signed)
Report called to Burnice Logan RN at [facility]. Awaiting patient's daughter arrival. She will be taking the patient by car to [facility]. Will continue to monitor. Erskin Burnet RN

## 2012-08-20 NOTE — Progress Notes (Signed)
Patient cleared for discharge. Packet copied and placed in [place]. Discharge summary and fl2 [facility]. Daughter to transport patient.  Estanislao Harmon C. Clint Strupp MSW, LCSW [phone]

## 2012-08-26 ENCOUNTER — Telehealth: Payer: Self-pay | Admitting: Cardiology

## 2012-08-26 NOTE — Telephone Encounter (Deleted)
New Problem ° ° °

## 2012-09-04 ENCOUNTER — Telehealth: Payer: Self-pay | Admitting: Cardiology

## 2012-09-04 ENCOUNTER — Encounter: Payer: 59 | Admitting: Cardiology

## 2012-09-04 ENCOUNTER — Ambulatory Visit (INDEPENDENT_AMBULATORY_CARE_PROVIDER_SITE_OTHER): Payer: Medicare Other | Admitting: Cardiology

## 2012-09-04 ENCOUNTER — Encounter: Payer: Self-pay | Admitting: Cardiology

## 2012-09-04 VITALS — BP 130/60 | HR 64 | Resp 18 | Ht 62.0 in | Wt 127.0 lb

## 2012-09-04 DIAGNOSIS — I080 Rheumatic disorders of both mitral and aortic valves: Secondary | ICD-10-CM

## 2012-09-04 DIAGNOSIS — I471 Supraventricular tachycardia: Secondary | ICD-10-CM

## 2012-09-04 DIAGNOSIS — I509 Heart failure, unspecified: Secondary | ICD-10-CM

## 2012-09-04 NOTE — Assessment & Plan Note (Signed)
The patient has had no further episodes of tachycardia that she can recall.  Her EKG today shows normal sinus rhythm at 64 per minute with normal PR interval and narrow QRS.  She will continue on present dose of metoprolol 50 mg twice a day.

## 2012-09-04 NOTE — Assessment & Plan Note (Signed)
The patient is on Lasix 20 mg daily.  She is reasonably well compensated.  He has lymphedema of the left arm but no significant lower extremity edema.

## 2012-09-04 NOTE — Assessment & Plan Note (Signed)
At the present time she seems reasonably well compensated and she is wearing nasal oxygen.  She denies any chest pain or shortness of breath.  She does have decreased memory and has some degree of dementia

## 2012-09-04 NOTE — Telephone Encounter (Signed)
New Problem:    Patient's daughter called in wanting to ask about her mother's appointment today.  Please call back.

## 2012-09-04 NOTE — Progress Notes (Signed)
[name] Date of Birth:  19-Apr-1916 [facility] [address] [place], [place]  [address] [phone]         Fax   [phone]  History of Present Illness: This pleasant 79 year old woman is seen for a post hospital followup office visit.  She was hospitalized at [facility] from 08/12/12 until 08/14/12 and was sent back to the nursing home.  She then returned on 08/17/12 until 08/20/12 because of recurrent problems with tachycardia and congestive heart failure.  She was found to be in atrial tachycardia and to have combined systolic and diastolic acute on chronic heart failure.  She responded to beta blockers and furosemide. She has a history of severe inoperable aortic valve disease. She has not been experiencing any chest pain.  He has a past history of bilateral breast cancer.  She's been having more problems with recent lymphedema of her left arm.  She has a history of hypoxemia and is now on round-the-clock oxygen.  She now resides in the skilled nursing facility section of [facility].  Current Outpatient Prescriptions  Medication Sig Dispense Refill  . aspirin EC 81 MG EC tablet Take 1 tablet (81 mg total) by mouth daily.  30 tablet  3  . Calcium Carbonate-Vitamin D (CALCIUM-D) 600-400 MG-UNIT TABS Take 1 tablet by mouth daily.        . cholecalciferol (VITAMIN D) 1000 UNITS tablet Take 1,000 Units by mouth daily.      . furosemide (LASIX) 20 MG tablet Take 1 tablet (20 mg total) by mouth daily. May take an extra dose of lasix for increasing SOB or edema  30 tablet  0  . lactose free nutrition (BOOST) LIQD Take 237 mLs by mouth 2 (two) times daily with a meal.      . meclizine (ANTIVERT) 25 MG tablet Take 25 mg by mouth 3 (three) times daily as needed. For vertigo      . metoprolol tartrate (LOPRESSOR) 25 MG tablet Take 2 tablets (50 mg total) by mouth 2 (two) times daily.      . mirtazapine (REMERON) 15 MG tablet Take 7.5 mg by mouth at bedtime. Take 1/2  tablet at bedtime      . Multiple Vitamin (MULITIVITAMIN WITH MINERALS) TABS Take 1 tablet by mouth daily. Centrum       . Multiple Vitamins-Minerals (ICAPS) CAPS Take 1 capsule by mouth daily.        . mupirocin ointment (BACTROBAN) 2 % Apply 1 application topically 2 (two) times daily. Place application into nose 2 times daily. For Bactroban nasal-with cotton tip swab apply pea-sized amount into each Darene Lamer. Gently press sides of nose together to spread ointment. Avoid contact with eyes. Use for 4 days  22 g  0  . nitroGLYCERIN (NITROQUICK) 0.4 MG SL tablet Place 1 tablet (0.4 mg total) under the tongue every 5 (five) minutes as needed for chest pain.  100 tablet  3  . polyethylene glycol (MIRALAX / GLYCOLAX) packet Take 17 g by mouth daily as needed. For constipation.       . potassium chloride (K-DUR) 10 MEQ tablet Take 10 mEq by mouth daily.          Allergies  Allergen Reactions  . Codeine Other (See Comments)    Per nursing home mar  . Darvocet (Propoxyphene-Acetaminophen) Other (See Comments)    Per nursing home mar  . Levofloxacin Other (See Comments)    Unknown reaction  . Lisinopril Other (See  Comments)    Unknown reaction  . Nitrofurantoin Monohyd Macro Other (See Comments)    Per nursing home mar  . Tequin Other (See Comments)    Unknown reaction    Patient Active Problem List  Diagnosis  . Dyspnea  . Aortic stenosis with mitral and aortic insufficiency  . History of breast cancer  . Epistaxis  . Vertigo  . Dizziness  . Brain aneurysm  . Acute heart failure  . Acute systolic HF (heart failure)  . Acute on chronic diastolic heart failure  . Acute on chronic renal insufficiency  . Tachycardia  . Chronic combined systolic and diastolic CHF, NYHA class 2  . Pulmonary hypertension  . Paroxysmal junctional tachycardia  . Stage III chronic kidney disease  . Thrombocytopenia    History  Smoking status  . Never Smoker   Smokeless tobacco  . Not on file     History  Alcohol Use No    Family History  Problem Relation Age of Onset  . Stroke Mother   . Cancer Father   . Cancer Sister   . Heart disease Brother     Review of Systems: Constitutional: no fever chills diaphoresis or fatigue or change in weight.  Head and neck: no hearing loss, no epistaxis, no photophobia or visual disturbance. Respiratory: No cough, shortness of breath or wheezing. Cardiovascular: No chest pain peripheral edema, palpitations. Gastrointestinal: No abdominal distention, no abdominal pain, no change in bowel habits hematochezia or melena. Genitourinary: No dysuria, no frequency, no urgency, no nocturia. Musculoskeletal:No arthralgias, no back pain, no gait disturbance or myalgias. Neurological: No dizziness, no headaches, no numbness, no seizures, no syncope, no weakness, no tremors. Hematologic: No lymphadenopathy, no easy bruising. Psychiatric: No confusion, no hallucinations, no sleep disturbance.    Physical Exam: Filed Vitals:   09/04/12 1027  BP: 130/60  Pulse: 64  Resp: 18   the general appearance reveals a somewhat disheveled elderly woman in no acute distress.  She is in a wheelchair.  She is wearing nasal oxygen.  Normal jugular venous pressure.  The chest reveals fine basilar inspiratory rales.  The heart reveals grade 3/6 harsh systolic ejection murmur at the base.The abdomen is soft and nontender. Bowel sounds are normal. The liver and spleen are not enlarged. There Are no abdominal masses. There are no bruits.  Extremities show no peripheral edema.  There is lymphedema of the left arm which is chronic.  Neurologic exam shows decreased memory.  Integument is normal.  EKG shows normal sinus rhythm and incomplete right bundle branch block  Assessment / Plan: Continue on present medication.  Recheck in 3 months for followup office visit and EKG.

## 2012-09-04 NOTE — Telephone Encounter (Signed)
Left message to call back  

## 2012-09-04 NOTE — Patient Instructions (Addendum)
Your physician recommends that you continue on your current medications as directed. Please refer to the Current Medication list given to you today.  Your physician recommends that you schedule a follow-up appointment in: 3 months  

## 2012-09-04 NOTE — Telephone Encounter (Signed)
Follow-up:    Called in to ask you to please call her back after 2:00pm.

## 2012-09-04 NOTE — Telephone Encounter (Signed)
Advised daughter no changes in medications today. Will see patient back in May

## 2012-09-11 ENCOUNTER — Telehealth: Payer: Self-pay | Admitting: Nurse Practitioner

## 2012-09-11 NOTE — Telephone Encounter (Signed)
Aurther Loft, RN from [facility] called this afternoon to report that Ms. Krahl's BP's have been elevated in the 170->200/100 range.  The in house NP rec increasing her metoprolol from 25 bid to 50 bid but wanted [place] notified first.  Her HR has been in the 70's.  I advised that we would agree with further titration of her metoprolol.  RN verbalized understanding.

## 2012-09-17 ENCOUNTER — Encounter: Payer: 59 | Admitting: Cardiology

## 2012-10-03 IMAGING — CR DG CHEST 2V
2 series · 2 of 2 positions shown · non-contrast
Comparison: On chest x-ray of 09/29/2006

CLINICAL DATA: Shortness of breath, history breast carcinoma

CHEST - 2 VIEW

[w chest pa *]
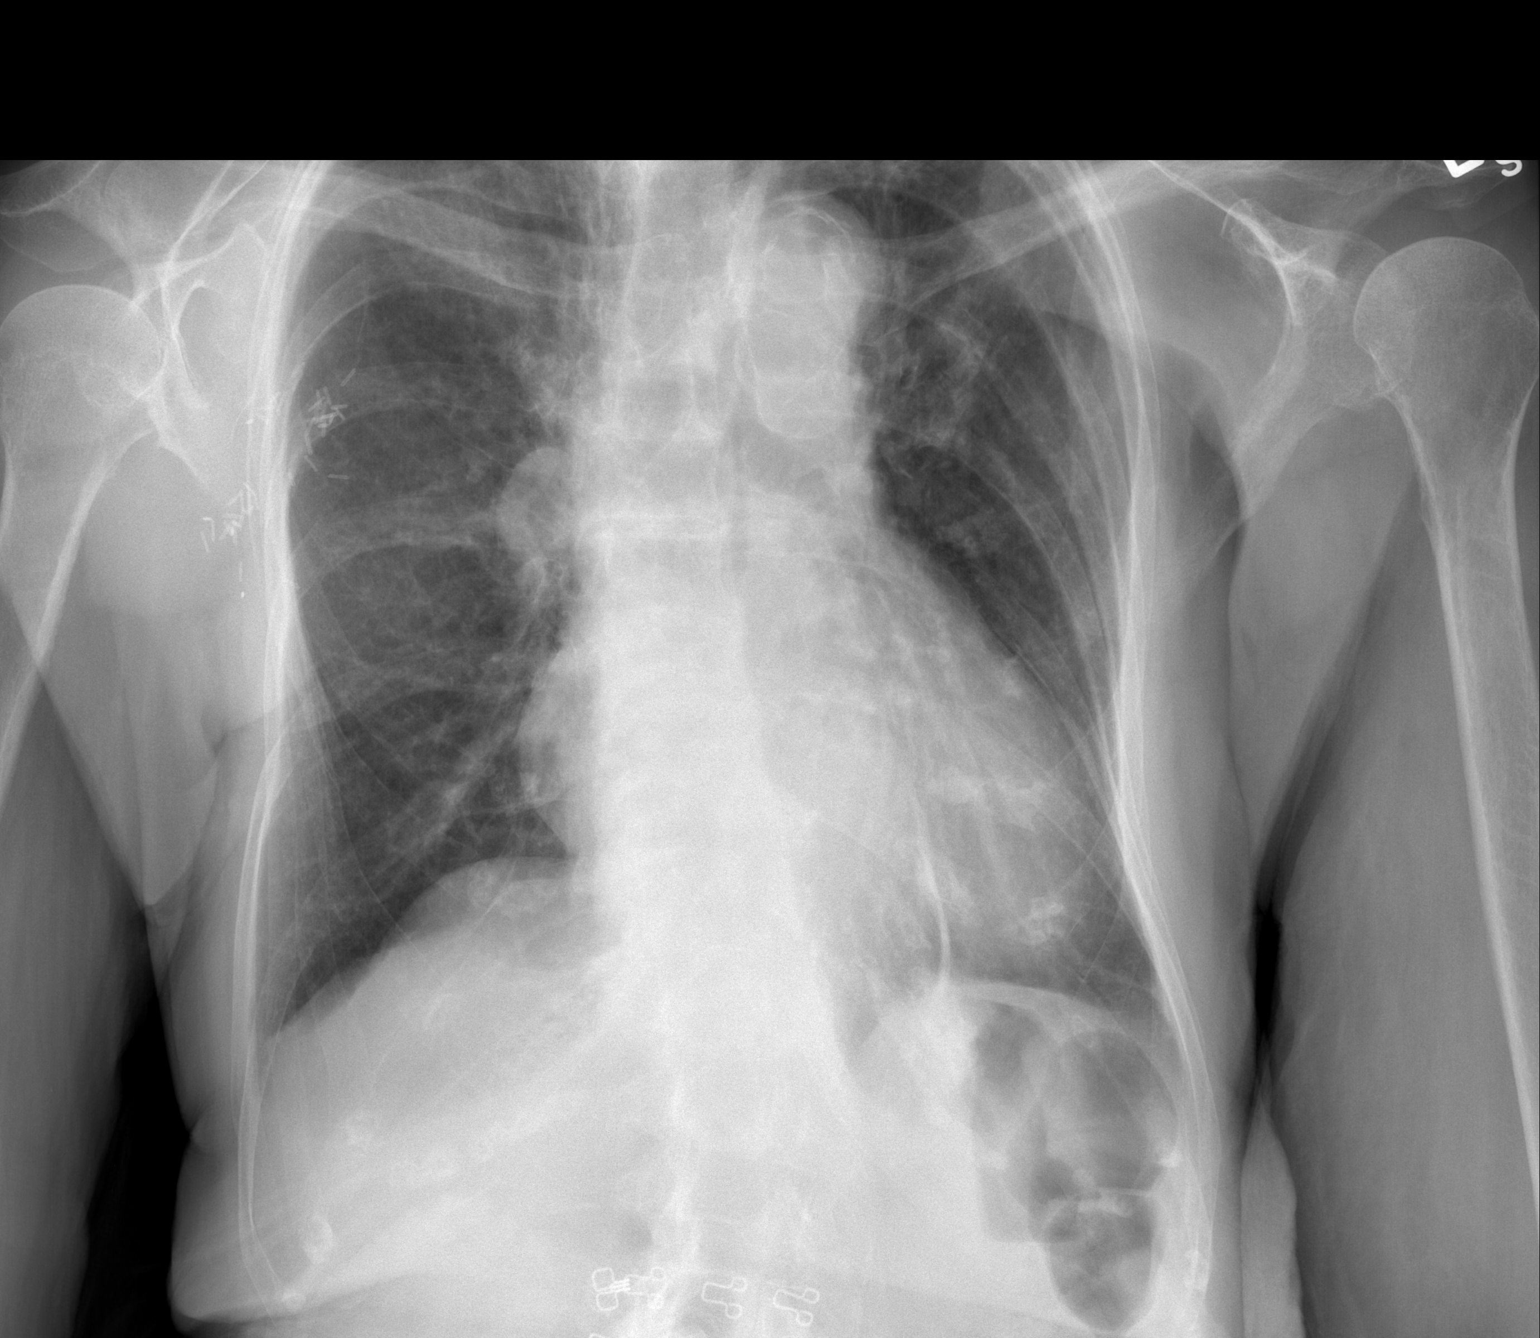

[w chest lat]
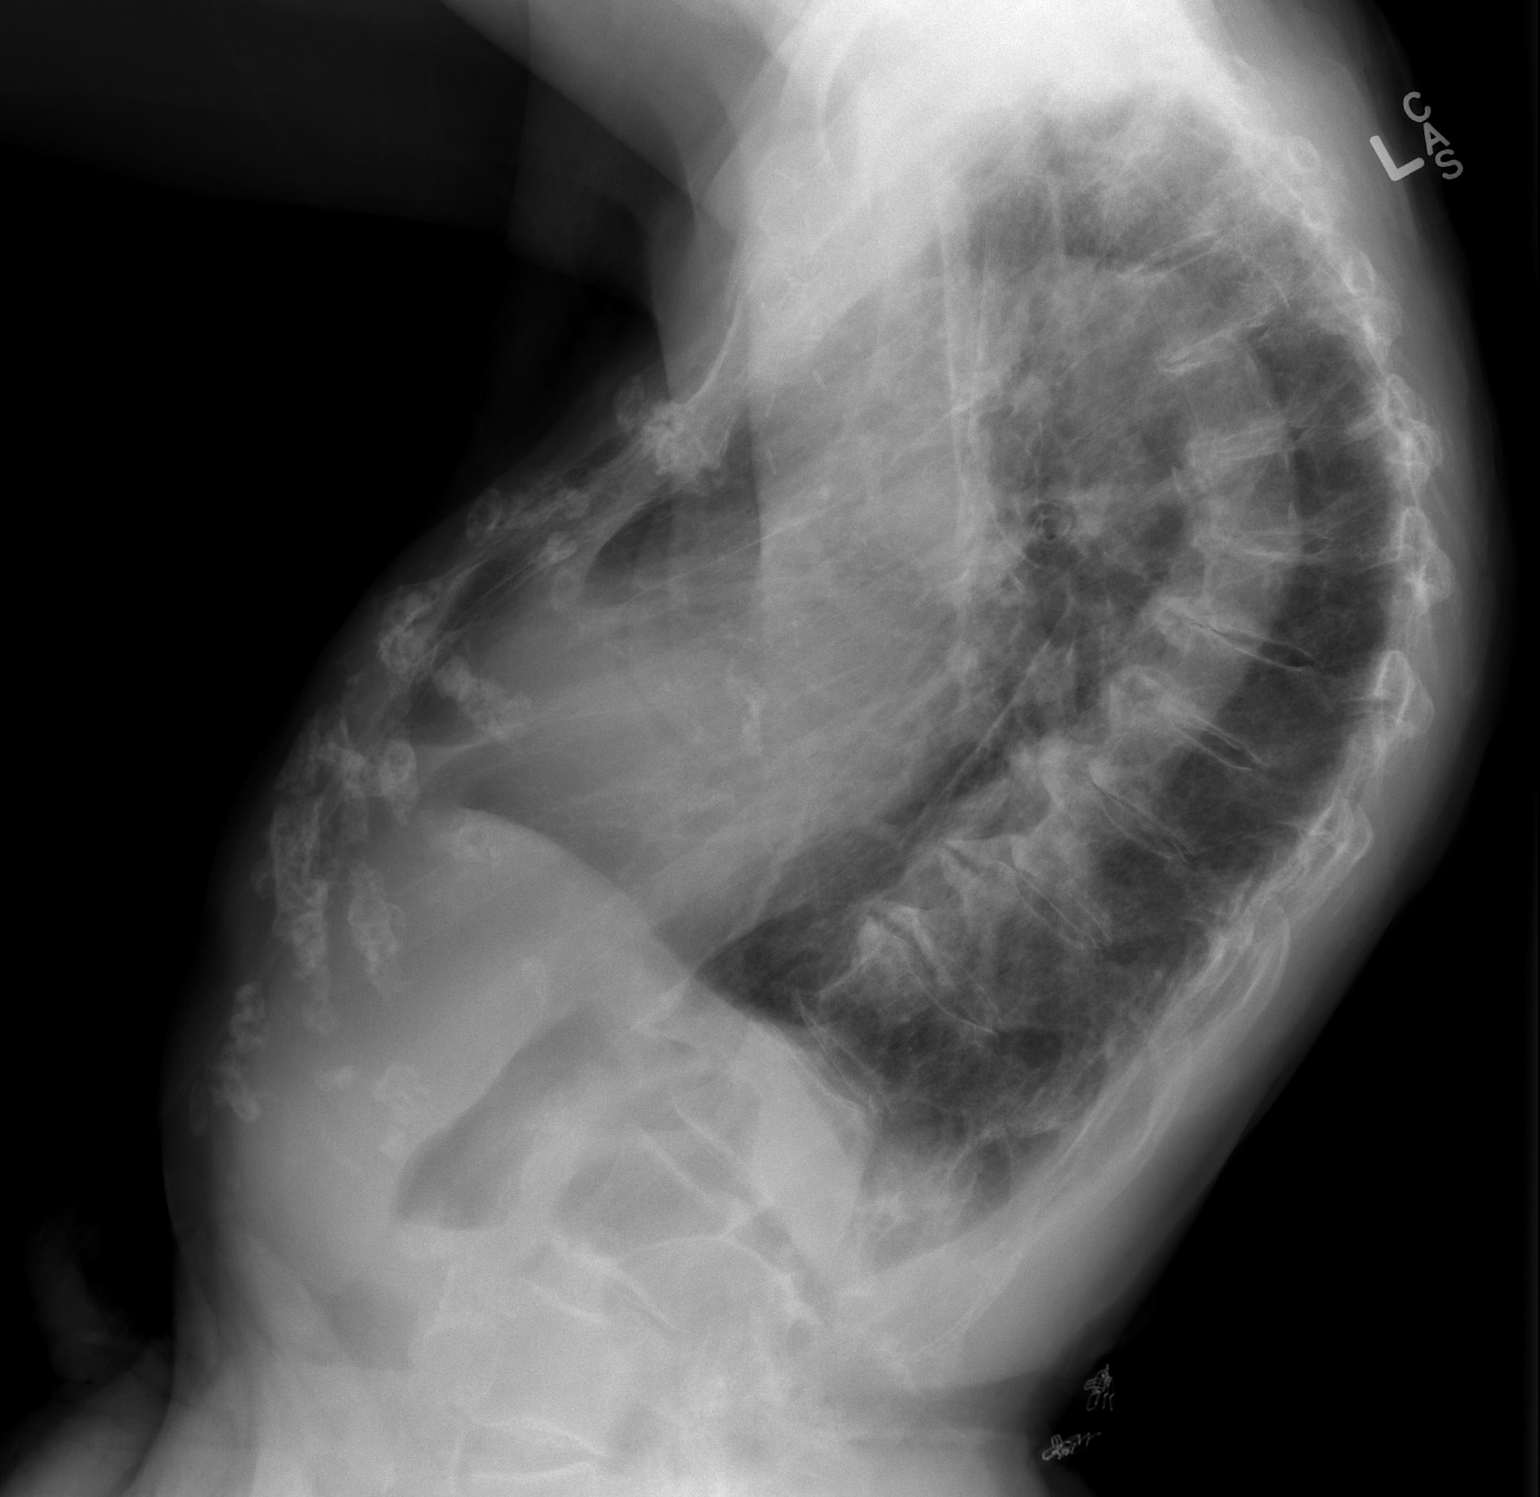

[2 of 2 positions shown; findings below may reference images not displayed]

FINDINGS: The lungs are clear and slightly hyperaerated.  Moderate
cardiomegaly is stable.  The bones are osteopenic.  Surgical clips
overlie the right axilla.  There are diffuse degenerative changes
throughout the thoracic spine.
IMPRESSION: Stable cardiomegaly.  No active lung disease.  Slight
hyperaeration.

## 2012-10-27 DEATH — deceased

## 2012-11-09 ENCOUNTER — Encounter: Payer: Self-pay | Admitting: Cardiology

## 2012-12-02 ENCOUNTER — Ambulatory Visit: Payer: Medicare Other | Admitting: Cardiology
